# Patient Record
Sex: Female | Born: 1965
Health system: Southern US, Community
[De-identification: ages and names within clinical notes are randomized; demographics above are authoritative.]

## PROBLEM LIST (undated history)

## (undated) DIAGNOSIS — I10 Essential (primary) hypertension: Secondary | ICD-10-CM

## (undated) DIAGNOSIS — R87610 Atypical squamous cells of undetermined significance on cytologic smear of cervix (ASC-US): Principal | ICD-10-CM

## (undated) DIAGNOSIS — Z87891 Personal history of nicotine dependence: Secondary | ICD-10-CM

## (undated) DIAGNOSIS — F419 Anxiety disorder, unspecified: Secondary | ICD-10-CM

## (undated) DIAGNOSIS — R87810 Cervical high risk human papillomavirus (HPV) DNA test positive: Principal | ICD-10-CM

## (undated) DIAGNOSIS — IMO0002 Reserved for concepts with insufficient information to code with codable children: Secondary | ICD-10-CM

## (undated) DIAGNOSIS — E049 Nontoxic goiter, unspecified: Secondary | ICD-10-CM

## (undated) DIAGNOSIS — K219 Gastro-esophageal reflux disease without esophagitis: Secondary | ICD-10-CM

## (undated) DIAGNOSIS — IMO0001 Reserved for inherently not codable concepts without codable children: Secondary | ICD-10-CM

## (undated) DIAGNOSIS — B0221 Postherpetic geniculate ganglionitis: Secondary | ICD-10-CM

## (undated) HISTORY — PX: TUBAL LIGATION: SHX77

## (undated) HISTORY — DX: Nontoxic goiter, unspecified: E04.9

## (undated) HISTORY — DX: Reserved for concepts with insufficient information to code with codable children: IMO0002

## (undated) HISTORY — DX: Essential (primary) hypertension: I10

## (undated) HISTORY — DX: Postherpetic geniculate ganglionitis: B02.21

## (undated) HISTORY — DX: Personal history of nicotine dependence: Z87.891

## (undated) HISTORY — PX: OTHER SURGICAL HISTORY: SHX169

## (undated) HISTORY — DX: Cervical high risk human papillomavirus (HPV) DNA test positive: R87.810

## (undated) HISTORY — DX: Anxiety disorder, unspecified: F41.9

## (undated) HISTORY — DX: Atypical squamous cells of undetermined significance on cytologic smear of cervix (ASC-US): R87.610

## (undated) HISTORY — DX: Reserved for inherently not codable concepts without codable children: IMO0001

## (undated) HISTORY — DX: Gastro-esophageal reflux disease without esophagitis: K21.9

---

## 1999-07-07 ENCOUNTER — Other Ambulatory Visit: Admission: RE | Admit: 1999-07-07 | Discharge: 1999-07-07 | Payer: Self-pay | Admitting: Gynecology

## 2001-03-22 ENCOUNTER — Other Ambulatory Visit: Admission: RE | Admit: 2001-03-22 | Discharge: 2001-03-22 | Payer: Self-pay | Admitting: Gynecology

## 2003-03-30 ENCOUNTER — Other Ambulatory Visit: Admission: RE | Admit: 2003-03-30 | Discharge: 2003-03-30 | Payer: Self-pay | Admitting: Gynecology

## 2004-04-18 ENCOUNTER — Other Ambulatory Visit: Admission: RE | Admit: 2004-04-18 | Discharge: 2004-04-18 | Payer: Self-pay | Admitting: Gynecology

## 2005-05-31 ENCOUNTER — Other Ambulatory Visit: Admission: RE | Admit: 2005-05-31 | Discharge: 2005-05-31 | Payer: Self-pay | Admitting: Gynecology

## 2006-07-25 ENCOUNTER — Ambulatory Visit: Payer: Self-pay | Admitting: Family Medicine

## 2006-08-09 ENCOUNTER — Ambulatory Visit: Payer: Self-pay | Admitting: Gastroenterology

## 2006-08-10 ENCOUNTER — Ambulatory Visit: Payer: Self-pay | Admitting: Family Medicine

## 2006-08-10 DIAGNOSIS — M549 Dorsalgia, unspecified: Secondary | ICD-10-CM | POA: Insufficient documentation

## 2006-08-10 DIAGNOSIS — L589 Radiodermatitis, unspecified: Secondary | ICD-10-CM | POA: Insufficient documentation

## 2006-08-10 DIAGNOSIS — R1011 Right upper quadrant pain: Secondary | ICD-10-CM | POA: Insufficient documentation

## 2006-08-10 DIAGNOSIS — F17200 Nicotine dependence, unspecified, uncomplicated: Secondary | ICD-10-CM | POA: Insufficient documentation

## 2006-09-12 ENCOUNTER — Ambulatory Visit: Payer: Self-pay | Admitting: Gastroenterology

## 2006-09-12 ENCOUNTER — Encounter: Payer: Self-pay | Admitting: Family Medicine

## 2006-09-18 ENCOUNTER — Encounter: Payer: Self-pay | Admitting: Family Medicine

## 2006-12-28 ENCOUNTER — Ambulatory Visit: Payer: Self-pay | Admitting: Gastroenterology

## 2007-01-01 ENCOUNTER — Encounter: Payer: Self-pay | Admitting: Family Medicine

## 2007-01-01 ENCOUNTER — Ambulatory Visit: Payer: Self-pay | Admitting: Gastroenterology

## 2007-04-15 DIAGNOSIS — K649 Unspecified hemorrhoids: Secondary | ICD-10-CM | POA: Insufficient documentation

## 2007-04-15 DIAGNOSIS — F411 Generalized anxiety disorder: Secondary | ICD-10-CM | POA: Insufficient documentation

## 2007-04-15 DIAGNOSIS — K625 Hemorrhage of anus and rectum: Secondary | ICD-10-CM | POA: Insufficient documentation

## 2007-04-15 DIAGNOSIS — K219 Gastro-esophageal reflux disease without esophagitis: Secondary | ICD-10-CM | POA: Insufficient documentation

## 2007-06-17 DIAGNOSIS — IMO0002 Reserved for concepts with insufficient information to code with codable children: Secondary | ICD-10-CM

## 2007-06-17 HISTORY — DX: Reserved for concepts with insufficient information to code with codable children: IMO0002

## 2007-12-06 ENCOUNTER — Other Ambulatory Visit: Admission: RE | Admit: 2007-12-06 | Discharge: 2007-12-06 | Payer: Self-pay | Admitting: Gynecology

## 2007-12-06 ENCOUNTER — Encounter: Payer: Self-pay | Admitting: Women's Health

## 2007-12-06 ENCOUNTER — Ambulatory Visit: Payer: Self-pay | Admitting: Women's Health

## 2007-12-23 ENCOUNTER — Ambulatory Visit: Payer: Self-pay | Admitting: Women's Health

## 2008-01-14 ENCOUNTER — Encounter (INDEPENDENT_AMBULATORY_CARE_PROVIDER_SITE_OTHER): Payer: Self-pay | Admitting: Orthopedic Surgery

## 2008-01-14 ENCOUNTER — Ambulatory Visit (HOSPITAL_BASED_OUTPATIENT_CLINIC_OR_DEPARTMENT_OTHER): Admission: RE | Admit: 2008-01-14 | Discharge: 2008-01-14 | Payer: Self-pay | Admitting: Orthopedic Surgery

## 2008-06-24 ENCOUNTER — Encounter: Payer: Self-pay | Admitting: Gastroenterology

## 2009-03-04 ENCOUNTER — Other Ambulatory Visit: Admission: RE | Admit: 2009-03-04 | Discharge: 2009-03-04 | Payer: Self-pay | Admitting: Gynecology

## 2009-03-04 ENCOUNTER — Ambulatory Visit: Payer: Self-pay | Admitting: Women's Health

## 2009-12-16 DIAGNOSIS — B0221 Postherpetic geniculate ganglionitis: Secondary | ICD-10-CM

## 2009-12-16 HISTORY — DX: Postherpetic geniculate ganglionitis: B02.21

## 2009-12-18 ENCOUNTER — Emergency Department (HOSPITAL_COMMUNITY)
Admission: EM | Admit: 2009-12-18 | Discharge: 2009-12-18 | Payer: Self-pay | Source: Home / Self Care | Admitting: Emergency Medicine

## 2010-03-14 ENCOUNTER — Other Ambulatory Visit (HOSPITAL_COMMUNITY)
Admission: RE | Admit: 2010-03-14 | Discharge: 2010-03-14 | Disposition: A | Discharge status: Home / Self Care | Payer: BC Managed Care – PPO | Source: Ambulatory Visit | Attending: Gynecology | Admitting: Gynecology

## 2010-03-14 ENCOUNTER — Encounter (INDEPENDENT_AMBULATORY_CARE_PROVIDER_SITE_OTHER): Payer: BC Managed Care – PPO | Admitting: Women's Health

## 2010-03-14 ENCOUNTER — Other Ambulatory Visit: Payer: Self-pay | Admitting: Women's Health

## 2010-03-14 DIAGNOSIS — Z833 Family history of diabetes mellitus: Secondary | ICD-10-CM

## 2010-03-14 DIAGNOSIS — Z01419 Encounter for gynecological examination (general) (routine) without abnormal findings: Secondary | ICD-10-CM

## 2010-03-14 DIAGNOSIS — Z13220 Encounter for screening for lipoid disorders: Secondary | ICD-10-CM

## 2010-03-14 DIAGNOSIS — E079 Disorder of thyroid, unspecified: Secondary | ICD-10-CM

## 2010-03-14 DIAGNOSIS — Z124 Encounter for screening for malignant neoplasm of cervix: Secondary | ICD-10-CM | POA: Insufficient documentation

## 2010-03-29 LAB — BASIC METABOLIC PANEL
CO2: 26 mEq/L (ref 19–32)
Calcium: 9.4 mg/dL (ref 8.4–10.5)
Creatinine, Ser: 0.86 mg/dL (ref 0.4–1.2)
GFR calc Af Amer: >60 mL/min (ref >60)
GFR calc non Af Amer: >60 mL/min (ref >60)

## 2010-03-29 LAB — DIFFERENTIAL
Basophils Relative: 1 % (ref 0–1)
Eosinophils Absolute: 0.2 10*3/uL (ref 0.0–0.7)
Lymphs Abs: 2.8 10*3/uL (ref 0.7–4.0)
Neutro Abs: 4.7 10*3/uL (ref 1.7–7.7)
Neutrophils Relative %: 55 % (ref 43–77)

## 2010-03-29 LAB — URINALYSIS, ROUTINE W REFLEX MICROSCOPIC
Hgb urine dipstick: NEGATIVE
Nitrite: NEGATIVE
Protein, ur: NEGATIVE mg/dL
Specific Gravity, Urine: 1.012 (ref 1.005–1.030)
Urobilinogen, UA: 0.2 mg/dL (ref 0.0–1.0)

## 2010-03-29 LAB — CBC
MCH: 31 pg (ref 26.0–34.0)
Platelets: 242 10*3/uL (ref 150–400)
RBC: 4.22 MIL/uL (ref 3.87–5.11)

## 2010-05-31 NOTE — Assessment & Plan Note (Signed)
[facility] OFFICE NOTE   Annette Wallace, Annette Wallace                       MRN:          [id number]  DATE:07/25/2006                            DOB:          May 03, 1965    NEW PATIENT EVALUATION   PRIMARY CARE PHYSICIAN:  Tinnie Gens A. Tawanna Cooler, M.D. at [place].   [name] is a 45 year old married white female gravida 2, para 2, AB 0  who comes in today as a new patient for evaluation of 2 problems.   PAST MEDICAL HISTORY:  Hospitalized for T&A as a child.  Childbirth x2.  A BTL after a second child.  She has a 104 year old son and a 13 year old  daughter, both in good health.   PAST ILLNESSES:  None.   INJURIES:  None.   DRUG ALLERGIES:  PENICILLIN and CODEINE both give her hives.   She smokes a pack of cigarettes a day.  Is unable to get off cigarettes.  She tried Chantix, but she complains of CNS side effects.  She said it  made her feel suicidal.  She has not drunk any alcohol.   MEDICATIONS:  1. Trazodone 50 mg 1/2 tablet nightly for sleep dysfunction.  2. An acid reducer because of chronic acid reflux with secondary to      nicotine.   FAMILY HISTORY:  Dad just recently died a week ago at age 58.  He was  retired Company secretary.  He went in for a carotid bypass surgery and died  post-op.  Prior to that, he had had an MI, and a CABG in 2007.  She does  not recall his underlying history.  I asked her to find out.  Mom is 71  and has a history of anxiety disorder and drug addiction.  One brother  and 1 sister both in good health, except her sister has had  hypothyroidism.   SOCIAL HISTORY:  She is married and lives here in [place], [place] for 25 years, originally from [place], [place].  She has 2  children.  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   Last Pap smear was about a year ago.  Last physical, she has not had 1  in years.   PROBLEM:  1. The patient had a  syncopal episode.  She said she was at the [facility]      with her brother in [place] sitting on a bench, felt light-      headed, went to get up to go to the bathroom, and passed out.  She      was taken by EMS to [facility] evaluation and      dictated notes are with her.  All of that evaluation was within      normal limits.  She subsequently left the hospital AMA because she      did not want to wait any further.  She comes in today at the behest      of her gynecologist, who is Dr. Lily Peer at [facility]  for medical evaluation.  She has not had any previous syncopal      episodes in the past.  She has had light-headed spells, 3, 4, or 5      times a year, but she has never passed out.  She did not have any      seizure activity.  Did not have any urinary incontinence, et      Annette Wallace.  Neurological review of systems negative.  2. The patient complains of a 2 to 3 year history of abdominal pain      and nausea.  She says it comes and goes worse in the morning.  She      points to the midepigastric and the right upper quadrant area of      her abdomen as the source of her discomfort.  She has no fevers or      chills, or diarrhea.  No change in bowel habits.  No change in      urinary habits.   FAMILY HISTORY:  Negative for abdominal problems, including gallbladder  disease.   PHYSICAL EVALUATION:  Height 5 feet 3 inches, weight 137, BP 110/66,  pulse 70 and regular, she is afebrile.  GENERAL:  She is a well-developed, well-nourished white female with a  deep tan.  She does not wear sunscreen.  She is from [place].  She  smells of tobacco.  Examination of the head, eyes, ears, nose, and throat were negative.  NECK:  Supple.  Thyroid is not enlarged.  No carotid bruits.  NEUROLOGIC:  Exam negative.  ABDOMEN:  Abdomen is flat.  Bowel sounds normal.  Liver, spleen, and  kidneys not enlarged.   IMPRESSION:  1. Syncopal episode.  Explained to her  what that was and what to do in      the future.  2. Abdominal discomfort, question gallbladder disease.  We will put      her on a fat-free diet and get an ultrasound of her gallbladder,      followup 3 days after ultrasound.  3. Tobacco abuse.  I outlined a program of smoking cessation.      However, she says she cannot take the Chantix.  4. She has had a lot of skin damage from the sun exposure.  I advised      her to wear sunscreen, SPF 50, hat with a bill.     Jeffrey A. Tawanna Cooler, MD  Electronically Signed    JAT/MedQ  DD: 07/25/2006  DT: 07/25/2006  Job #: [id number]   cc:   Gaetano Hawthorne. Lily Peer, M.D.

## 2010-05-31 NOTE — Op Note (Signed)
NAMEELISAVET, BUEHRER                ACCOUNT NO.:  [id number]   MEDICAL RECORD NO.:  [id number]          PATIENT TYPE:  AMB   LOCATION:  DSC                          FACILITY:  [facility]   PHYSICIAN:  Katy Fitch. Sypher, M.D. DATE OF BIRTH:  11/15/1965   DATE OF PROCEDURE:  01/14/2008  DATE OF DISCHARGE:                               OPERATIVE REPORT   PREOPERATIVE DIAGNOSIS:  Mass, volar aspect of right wrist, adjacent to  palmaris longus tendon with inflammatory features and enlargement and  MRI enhancement with contrast.  The radiologist who reviewed the MRI  suggested that this could be an nerve-element tumor or tendinosis of the  palmaris longus tendon.   POSTOPERATIVE DIAGNOSIS:  Mid-substance rupture of palmaris longus  tendon with calcified necrotic distal tendon stump.   OPERATIONS:  Exploration of volar forearm mass with resection of  calcific probable avascular distal segment of palmaris longus and  surrounding synovitis.   OPERATIONS:  Katy Fitch. Sypher, MD   ASSISTANT:  No assistant.   ANESTHESIA:  General by LMA.   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 Miguel Aschoff for evaluation  and management of a volar right wrist mass.   Ms. Harral had the experiences for several weeks and noted inflammation  including rubor and discomfort adjacent to the palmaris longus and  flexor carpi radialis tendons of the distal forearm.  Clinical  examination revealed a mass that was approximately 6 cm in length, 5-6  mm in width, and clearly inflammatory.  This had been enlarging.   This appeared to be in the region of the palmaris longus tendon;  however, due to its proximity to the median nerve and its growth, we  elected to proceed with an MRI.  On December 26, 2007, she was sent for  an MRI at [facility] that was reviewed by Dr. Dagoberto Reef.  Dr.  Vear Clock  was concerned about the appearance of the mass and requested  further cuts be performed with contrast.  A gadolinium-enhanced MRI was  performed on December 31, 2007, which revealed enhancement of the mass.  Dr. Vear Clock concluded that this could be tendinosis of the palmaris  longus tendon or perhaps a nerve-element tumor of the cutaneous nerve.   Given the latter statement, we felt obligated to proceed with excisional  biopsy for definitive diagnosis.   Preoperatively, Ms. Verne was advised of the anticipated surgical  procedure.  She was informed that this was for diagnosis and hopeful  resolution of this process.  We could not state with certainty whether  or not this was a benign process or some type of neoplasm.   After informed consent, she is brought to the operating room at this  time.   PROCEDURE:  Blair Mesina was brought to the operating room and placed in  the supine position upon the operating table.   Following an anesthesia consult with Dr. Gypsy Balsam, general anesthesia by  LMA technique was recommended and accepted.   Ms. Budzinski  was brought to room #2 of the [facility], placed in  the supine position upon the operating table, and under Dr. Burnett Corrente  direct supervision, general anesthesia by LMA technique was induced.   The right arm was prepped with Betadine soap and solution, sterilely  draped.  Ancef 1 g was administered as an IV prophylactic antibiotic.   The procedure was commenced with a longitudinal incision measuring  approximate 5 cm.  This was taken directly down to the area of the  palmaris longus tendon.  Immediately upon parting the dermis, we  identified what appeared to be a calcific, very necrotic-appearing  palmaris longus tendon.  This was dissected proximally until pseudo-  tendon was encountered.  It was obvious that Ms. Gullo had sustained a  mid-substance rupture of the palmaris longus with distal retraction of  the distal fragment with  probable necrosis and then significant  peritendinous inflammatory response.  The mass did not perforate deep to  the deep fascia and did not involve the median nerve or the flexor carpi  radialis.  The palmar cutaneous branch of the median nerve was  preserved.   This tendon stump was delivered as a biopsy specimen.  The wound was  then inspected for bleeding points and subsequent repaired with  subcutaneous suture of 4-0 Vicryl and intradermal segmental 3-0 Prolene  with Steri-Strips.  Lidocaine 2% was infiltrated for postoperative  comfort.  Ms. Rappleye tolerated the surgery and anesthesia well.  She was  transferred to the recovery room in stable signs.   We will see her back for followup in approximately 1 week for suture  removal and advancement into an exercise program.      Katy Fitch. Sypher, M.D.  Electronically Signed     RVS/MEDQ  D:  01/14/2008  T:  01/15/2008  Job:  [id number]   cc:   C. Duane Lope, M.D.

## 2010-05-31 NOTE — Assessment & Plan Note (Signed)
[facility]                         GASTROENTEROLOGY OFFICE NOTE   Katty, Fretwell YOUNIQUE CASAD                       MRN:          [id number]  DATE:09/12/2006                            DOB:          [birth year]    REASON FOR REFERRAL:  Dr. Tawanna Cooler asked me to evaluate Ms. Finkbiner in consult  for her epigastric pain and bright red blood per rectum.   HISTORY OF PRESENT ILLNESS:  Ms. Kuhnle is a very pleasant 45 year old  woman who has had at least four years of intermittent abdominal  discomfort.  She describes this as a bloating, belching sensation  associated with nausea.  It happens on a daily basis.  Caffeine is a  definite trigger for this as well as orange juice.  She has been on H2  blocker famotidine four times a day for several years.  Without that,  she will have bad pyrosis.  She has tried Prilosec in the past and says  it does not work much better than the H2 blocker.  For the past several  months or so, she has been having intermittent solid food dysphagia  approximately one time a week and has actually had to vomit up food to  get relief at times.  She had recent abdominal ultrasound to evaluate  this pain in July, 2008, and it was normal.   She also has had some mild rectal bleeding.  She thought this was  hemorrhoids.  It happened about three months ago for 1-2 days.  It was  while she was moving her bowels.   The review of systems is essentially normal and is available on her  nursing intake sheet.   PAST MEDICAL HISTORY:  None.   CURRENT MEDICATIONS:  Trazodone and aspirin 81 mg.   ALLERGIES:  PENICILLIN.   SOCIAL HISTORY:  Married with four children.  Self-employed.  Owns a  IT consultant.  Smokes one pack of cigarettes a day.  Drinks at least  2-3 coffees a day.  Does not eat a lot of peppermints, chocolate, does  not drink alcohol.   FAMILY HISTORY:  No colon cancer or colon polyps in the family.   PHYSICAL EXAMINATION:  Height 5  feet 3 inches.  Weight 137 pounds.  Blood pressure 114/78, pulse 64.  CONSTITUTIONAL:  Generally well-appearing.  NEUROLOGIC:  Alert and oriented x3.  Eyes:  Extraocular movements intact.  Mouth:  Oropharynx moist, no  lesions.  NECK:  Supple.  No lymphadenopathy.  CARDIOVASCULAR:  Heart regular rate and rhythm.  LUNGS:  Clear to auscultation bilaterally.  ABDOMEN:  Soft, nontender, nondistended.  Normal bowel sounds.  EXTREMITIES:  No lower extremity edema.  SKIN:  No rashes or lesions of the visible extremities.   ASSESSMENT/PLAN:  A 45 year old woman with likely gastroesophageal  reflux disease related dyspepsia, minor rectal bleeding, probably  hemorrhoidal.   I think she does have a problem with acid reflux.  I have instructed her  to change from the H2 blocker to PPI.  I have given her samples of  Nexium and instructed her to take it 20-30 minutes prior to  her dinner  meal on a daily basis.  I have also given her a GERD handout and  discussed some lifestyle modifications she could employ to reduce acid  regurgitation, including cutting back on cigarette smoking and caffeine.  She does have some mild solid food dysphagia that is probably not from  malignancy, but we should proceed with an EGD at her soonest convenience  to screen her for chronic gastroesophageal reflux disease complications,  such as Barrett's esophagitis.  Her second and likely unrelated problem  is minor rectal bleeding that occurred 2-3 months ago.  She will get a  CBC to make sure she is not anemic, although she does not appear to be  so clinically.  I will arrange for her to have a full colonoscopy at her  soonest convenience at the same time as her upper endoscopy.  She wants  to check out her deductible and her copays for these two procedures and  will get back to me with word on whether she wants to go ahead and  proceed with one or both of them.     Rachael Fee, MD  Electronically Signed     DPJ/MedQ  DD: 09/12/2006  DT: 09/13/2006  Job #: [id number]   cc:   Tinnie Gens A. Tawanna Cooler, MD

## 2010-06-07 ENCOUNTER — Ambulatory Visit (INDEPENDENT_AMBULATORY_CARE_PROVIDER_SITE_OTHER): Payer: BC Managed Care – PPO | Admitting: Gynecology

## 2010-06-07 DIAGNOSIS — R350 Frequency of micturition: Secondary | ICD-10-CM

## 2010-06-07 DIAGNOSIS — Z113 Encounter for screening for infections with a predominantly sexual mode of transmission: Secondary | ICD-10-CM

## 2010-06-07 DIAGNOSIS — N898 Other specified noninflammatory disorders of vagina: Secondary | ICD-10-CM

## 2010-06-07 DIAGNOSIS — B373 Candidiasis of vulva and vagina: Secondary | ICD-10-CM

## 2010-06-14 ENCOUNTER — Other Ambulatory Visit: Payer: Self-pay | Admitting: Gastroenterology

## 2010-06-15 NOTE — Telephone Encounter (Signed)
Pt needs office visit for further refills 

## 2010-10-21 LAB — POCT HEMOGLOBIN-HEMACUE: Hemoglobin: 14.1 g/dL (ref 12.0–15.0)

## 2011-01-12 ENCOUNTER — Other Ambulatory Visit: Payer: Self-pay | Admitting: Family Medicine

## 2011-01-12 DIAGNOSIS — R531 Weakness: Secondary | ICD-10-CM

## 2011-01-12 DIAGNOSIS — M542 Cervicalgia: Secondary | ICD-10-CM

## 2011-01-13 ENCOUNTER — Other Ambulatory Visit: Payer: BC Managed Care – PPO

## 2011-01-19 ENCOUNTER — Ambulatory Visit
Admission: RE | Admit: 2011-01-19 | Discharge: 2011-01-19 | Disposition: A | Discharge status: Home / Self Care | Payer: BC Managed Care – PPO | Source: Ambulatory Visit | Attending: Family Medicine | Admitting: Family Medicine

## 2011-01-19 DIAGNOSIS — M542 Cervicalgia: Secondary | ICD-10-CM

## 2011-01-19 DIAGNOSIS — R531 Weakness: Secondary | ICD-10-CM

## 2011-01-19 MED ORDER — GADOBENATE DIMEGLUMINE 529 MG/ML IV SOLN
14.0000 mL | Freq: Once | INTRAVENOUS | Status: AC | PRN
  Administered 2011-01-19: 14 mL via INTRAVENOUS

## 2011-02-06 ENCOUNTER — Other Ambulatory Visit: Payer: Self-pay | Admitting: Family Medicine

## 2011-02-06 DIAGNOSIS — E049 Nontoxic goiter, unspecified: Secondary | ICD-10-CM

## 2011-02-10 ENCOUNTER — Ambulatory Visit
Admission: RE | Admit: 2011-02-10 | Discharge: 2011-02-10 | Disposition: A | Discharge status: Home / Self Care | Payer: BC Managed Care – PPO | Source: Ambulatory Visit | Attending: Family Medicine | Admitting: Family Medicine

## 2011-02-10 ENCOUNTER — Other Ambulatory Visit: Payer: BC Managed Care – PPO

## 2011-02-10 DIAGNOSIS — E049 Nontoxic goiter, unspecified: Secondary | ICD-10-CM

## 2011-02-17 HISTORY — PX: CERVICAL SPINE SURGERY: SHX589

## 2011-03-08 ENCOUNTER — Telehealth: Payer: Self-pay | Admitting: *Deleted

## 2011-03-08 ENCOUNTER — Other Ambulatory Visit: Payer: Self-pay | Admitting: Neurosurgery

## 2011-03-08 ENCOUNTER — Ambulatory Visit
Admission: RE | Admit: 2011-03-08 | Discharge: 2011-03-08 | Disposition: A | Discharge status: Home / Self Care | Payer: BC Managed Care – PPO | Source: Ambulatory Visit | Attending: Neurosurgery | Admitting: Neurosurgery

## 2011-03-08 DIAGNOSIS — M542 Cervicalgia: Secondary | ICD-10-CM

## 2011-03-08 MED ORDER — ESOMEPRAZOLE MAGNESIUM 40 MG PO CPDR: 40.0000 mg | DELAYED_RELEASE_CAPSULE | Freq: Every day | ORAL | Status: DC

## 2011-03-08 NOTE — Telephone Encounter (Signed)
rx sent to pharmacy

## 2011-03-08 NOTE — Telephone Encounter (Signed)
Please call in nexium 40  #30 for pt and we will review at annual exam.

## 2011-03-08 NOTE — Telephone Encounter (Signed)
Pt called requesting refill on nexium 40 mg. Pt has annual schedule on 03/16/11. Okay to fill?

## 2011-03-13 DIAGNOSIS — IMO0002 Reserved for concepts with insufficient information to code with codable children: Secondary | ICD-10-CM | POA: Insufficient documentation

## 2011-03-13 DIAGNOSIS — F419 Anxiety disorder, unspecified: Secondary | ICD-10-CM | POA: Insufficient documentation

## 2011-03-13 DIAGNOSIS — IMO0001 Reserved for inherently not codable concepts without codable children: Secondary | ICD-10-CM | POA: Insufficient documentation

## 2011-03-13 DIAGNOSIS — B0221 Postherpetic geniculate ganglionitis: Secondary | ICD-10-CM | POA: Insufficient documentation

## 2011-03-13 DIAGNOSIS — Z87891 Personal history of nicotine dependence: Secondary | ICD-10-CM | POA: Insufficient documentation

## 2011-03-16 ENCOUNTER — Ambulatory Visit (INDEPENDENT_AMBULATORY_CARE_PROVIDER_SITE_OTHER): Payer: BC Managed Care – PPO | Admitting: Women's Health

## 2011-03-16 ENCOUNTER — Encounter: Payer: Self-pay | Admitting: Women's Health

## 2011-03-16 VITALS — BP 120/78 | Ht 63.0 in | Wt 143.0 lb

## 2011-03-16 DIAGNOSIS — F411 Generalized anxiety disorder: Secondary | ICD-10-CM

## 2011-03-16 DIAGNOSIS — F419 Anxiety disorder, unspecified: Secondary | ICD-10-CM

## 2011-03-16 DIAGNOSIS — B009 Herpesviral infection, unspecified: Secondary | ICD-10-CM

## 2011-03-16 MED ORDER — VALACYCLOVIR HCL 500 MG PO TABS: 500.0000 mg | ORAL_TABLET | Freq: Two times a day (BID) | ORAL | Status: AC

## 2011-03-16 MED ORDER — ALPRAZOLAM 0.25 MG PO TABS: 0.2500 mg | ORAL_TABLET | Freq: Every evening | ORAL | Status: AC | PRN

## 2011-03-16 NOTE — Progress Notes (Signed)
Annette Wallace 1966-01-07 [id number]    History:    The patient presents for annual exam.  Monthly 7 day cycle/BTL. Rare HSV outbreaks. Smoker had quit is now smoking several cigarettes per day, using Nicoderm patch. Currently treated for hypercholesterolemia with Lipitor. Bell's palsy. History of normal Paps and mammograms.   Past medical history, past surgical history, family history and social history were all reviewed and documented in the EPIC chart. History of left eye cancer in 2009, has annual skin checks at [facility].   ROS:  A  ROS was performed and pertinent positives and negatives are included in the history.  Exam:  Filed Vitals:   03/16/11 1404  BP: 120/78    General appearance:  Normal Head/Neck:  Normal, without cervical or supraclavicular adenopathy. Thyroid:  Symmetrical, normal in size, without palpable masses or nodularity. Respiratory  Effort:  Normal  Auscultation:  Clear without wheezing or rhonchi Cardiovascular  Auscultation:  Regular rate, without rubs, murmurs or gallops  Edema/varicosities:  Not grossly evident Abdominal  Soft,nontender, without masses, guarding or rebound.  Liver/spleen:  No organomegaly noted  Hernia:  None appreciated  Skin  Inspection:  Grossly normal  Palpation:  Grossly normal Neurologic/psychiatric  Orientation:  Normal with appropriate conversation.  Mood/affect:  Normal  Genitourinary    Breasts: Examined lying and sitting.     Right: Without masses, retractions, discharge or axillary adenopathy.     Left: Without masses, retractions, discharge or axillary adenopathy.   Inguinal/mons:  Normal without inguinal adenopathy  External genitalia:  Normal  BUS/Urethra/Skene's glands:  Normal  Bladder:  Normal  Vagina:  Normal  Cervix:  Normal  Uterus:   normal in size, shape and contour.  Midline and mobile  Adnexa/parametria:     Rt: Without masses or tenderness.   Lt: Without masses or tenderness.  Anus and  perineum: Normal  Digital rectal exam: Normal sphincter tone without palpated masses or tenderness  Assessment/Plan:  46 y.o. MWF G2 P2 for annual exam.     Normal GYN exam Rare HSV outbreaks Hypercholesterolemia-lipitor, Reflux/GERD stable on Nexium 40 daily-labs and meds at primary care Smoker 2-5 cigarettes per day Marital issues  Plan: Continue counseling for marriage, denies any infidelity or abuse. SBE's, annual mammogram, exercise, calcium rich diet, vitamin D 2000 daily. Valtrex 500 by mouth twice a day 2-3 days when necessary, prescription given. Xanax 0.25 at bedtime when necessary for rest. Is aware addictive will use sparingly. Continue labs and meds at primary care for GERD and hypercholesterolemia. Continue trying to decrease cigarettes, is aware of hazards to health. ACOG current Pap recommendations reviewed.      Harrington Challenger [facility], 5:14 PM 03/16/2011

## 2011-05-23 ENCOUNTER — Telehealth: Payer: Self-pay | Admitting: *Deleted

## 2011-05-23 NOTE — Telephone Encounter (Signed)
Pt is on prednisone by another dr and c/o vaginal itching. Requesting something for it. No d/c or any other sx's.

## 2011-05-24 MED ORDER — TERCONAZOLE 0.4 % VA CREA: TOPICAL_CREAM | VAGINAL | Status: DC

## 2011-05-24 NOTE — Telephone Encounter (Signed)
Please call in terazol 7 vag cream  1 applicator hs for 7, ov if no relief.

## 2011-05-24 NOTE — Telephone Encounter (Signed)
Left message rx sent on pt voicemail

## 2011-06-14 ENCOUNTER — Other Ambulatory Visit: Payer: Self-pay | Admitting: Neurosurgery

## 2011-06-14 DIAGNOSIS — M542 Cervicalgia: Secondary | ICD-10-CM

## 2011-06-20 ENCOUNTER — Ambulatory Visit
Admission: RE | Admit: 2011-06-20 | Discharge: 2011-06-20 | Disposition: A | Discharge status: Home / Self Care | Payer: BC Managed Care – PPO | Source: Ambulatory Visit | Attending: Neurosurgery | Admitting: Neurosurgery

## 2011-06-20 DIAGNOSIS — M542 Cervicalgia: Secondary | ICD-10-CM

## 2011-07-03 ENCOUNTER — Telehealth: Payer: Self-pay | Admitting: *Deleted

## 2011-07-03 MED ORDER — NAPROXEN SODIUM 550 MG PO TABS: 550.0000 mg | ORAL_TABLET | Freq: Every day | ORAL | Status: DC

## 2011-07-03 NOTE — Telephone Encounter (Signed)
Yes please call in for pt.  

## 2011-07-03 NOTE — Telephone Encounter (Signed)
rx sent to pharmacy, pt informed. 

## 2011-07-03 NOTE — Telephone Encounter (Signed)
Pt calling requesting rx on naproxen 550 mg, okay to fill?

## 2011-09-02 ENCOUNTER — Other Ambulatory Visit: Payer: Self-pay | Admitting: Women's Health

## 2011-09-06 NOTE — Telephone Encounter (Signed)
Okay to refill, please call patient remind not to take on a regular basis, bad for the liver.

## 2011-09-28 ENCOUNTER — Telehealth: Payer: Self-pay | Admitting: *Deleted

## 2011-09-28 ENCOUNTER — Other Ambulatory Visit: Payer: Self-pay | Admitting: Women's Health

## 2011-09-28 DIAGNOSIS — F419 Anxiety disorder, unspecified: Secondary | ICD-10-CM

## 2011-09-28 MED ORDER — ALPRAZOLAM 0.25 MG PO TABS: 0.2500 mg | ORAL_TABLET | Freq: Every evening | ORAL | Status: AC | PRN

## 2011-09-28 NOTE — Telephone Encounter (Signed)
TC - Marriage problems, will continue counseling, requests xanax for rest and on edge.  Will call in.

## 2011-09-28 NOTE — Telephone Encounter (Signed)
Pt asked you to call her at [phone].

## 2011-09-29 NOTE — Progress Notes (Signed)
xanax 0.49m tablet called into pharmacy.

## 2011-10-19 DIAGNOSIS — G5139 Clonic hemifacial spasm, unspecified: Secondary | ICD-10-CM | POA: Insufficient documentation

## 2011-11-02 ENCOUNTER — Other Ambulatory Visit: Payer: Self-pay | Admitting: Women's Health

## 2011-11-10 ENCOUNTER — Other Ambulatory Visit: Payer: Self-pay | Admitting: Women's Health

## 2012-04-18 ENCOUNTER — Other Ambulatory Visit: Payer: Self-pay | Admitting: Women's Health

## 2013-01-23 ENCOUNTER — Other Ambulatory Visit: Payer: Self-pay | Admitting: Women's Health

## 2013-01-23 MED ORDER — VALACYCLOVIR HCL 500 MG PO TABS: 500.0000 mg | ORAL_TABLET | Freq: Two times a day (BID) | ORAL | Status: DC

## 2013-01-23 NOTE — Telephone Encounter (Signed)
Pt has annual scheduled on 02/12/13. Pt also would like refill for valtrex as well.

## 2013-02-05 ENCOUNTER — Ambulatory Visit (INDEPENDENT_AMBULATORY_CARE_PROVIDER_SITE_OTHER): Payer: BC Managed Care – PPO | Admitting: Women's Health

## 2013-02-05 ENCOUNTER — Other Ambulatory Visit (HOSPITAL_COMMUNITY)
Admission: RE | Admit: 2013-02-05 | Discharge: 2013-02-05 | Disposition: A | Discharge status: Home / Self Care | Payer: BC Managed Care – PPO | Source: Ambulatory Visit | Attending: Gynecology | Admitting: Gynecology

## 2013-02-05 ENCOUNTER — Encounter: Payer: Self-pay | Admitting: Women's Health

## 2013-02-05 VITALS — BP 110/70 | Ht 65.0 in | Wt 152.0 lb

## 2013-02-05 DIAGNOSIS — J029 Acute pharyngitis, unspecified: Secondary | ICD-10-CM

## 2013-02-05 DIAGNOSIS — Z01419 Encounter for gynecological examination (general) (routine) without abnormal findings: Secondary | ICD-10-CM | POA: Insufficient documentation

## 2013-02-05 DIAGNOSIS — Z833 Family history of diabetes mellitus: Secondary | ICD-10-CM

## 2013-02-05 DIAGNOSIS — E079 Disorder of thyroid, unspecified: Secondary | ICD-10-CM

## 2013-02-05 DIAGNOSIS — Z13220 Encounter for screening for lipoid disorders: Secondary | ICD-10-CM

## 2013-02-05 DIAGNOSIS — E039 Hypothyroidism, unspecified: Secondary | ICD-10-CM

## 2013-02-05 LAB — CBC WITH DIFFERENTIAL/PLATELET
BASOS PCT: 1 % (ref 0–1)
Basophils Absolute: 0.1 10*3/uL (ref 0.0–0.1)
Eosinophils Absolute: 0.1 10*3/uL (ref 0.0–0.7)
Eosinophils Relative: 2 % (ref 0–5)
HEMATOCRIT: 37.9 % (ref 36.0–46.0)
HEMOGLOBIN: 13 g/dL (ref 12.0–15.0)
LYMPHS ABS: 1.7 10*3/uL (ref 0.7–4.0)
Lymphocytes Relative: 28 % (ref 12–46)
MCH: 30.6 pg (ref 26.0–34.0)
MCHC: 34.3 g/dL (ref 30.0–36.0)
MCV: 89.2 fL (ref 78.0–100.0)
MONO ABS: 0.5 10*3/uL (ref 0.1–1.0)
MONOS PCT: 8 % (ref 3–12)
NEUTROS ABS: 3.6 10*3/uL (ref 1.7–7.7)
Neutrophils Relative %: 61 % (ref 43–77)
Platelets: 287 10*3/uL (ref 150–400)
RBC: 4.25 MIL/uL (ref 3.87–5.11)
RDW: 13.7 % (ref 11.5–15.5)
WBC: 6 10*3/uL (ref 4.0–10.5)

## 2013-02-05 LAB — URINALYSIS W MICROSCOPIC + REFLEX CULTURE
BACTERIA UA: NONE SEEN
Bilirubin Urine: NEGATIVE
CRYSTALS: NONE SEEN
Casts: NONE SEEN
Glucose, UA: NEGATIVE mg/dL
Hgb urine dipstick: NEGATIVE
Ketones, ur: NEGATIVE mg/dL
Leukocytes, UA: NEGATIVE
NITRITE: NEGATIVE
Protein, ur: NEGATIVE mg/dL
RBC / HPF: NONE SEEN RBC/hpf (ref <3)
SQUAMOUS EPITHELIAL / LPF: NONE SEEN
UROBILINOGEN UA: 0.2 mg/dL (ref 0.0–1.0)
WBC, UA: NONE SEEN WBC/hpf (ref <3)
pH: 6 (ref 5.0–8.0)

## 2013-02-05 NOTE — Addendum Note (Signed)
Addended by: Richardson ChiquitoWILKINSON, KARI S on: 02/05/2013 10:20 AM   Modules accepted: Orders

## 2013-02-05 NOTE — Patient Instructions (Signed)

## 2013-02-05 NOTE — Progress Notes (Signed)
Annette Wallace 1965-03-18 [id number]    History:    Presents for annual exam.  Monthly cycles/BTL. History of left eye squamous [facility]. Ruptured disc, surgery 2013 doing better. Hypothyroid currently on no medication. Having some marital issues mostly related to stepdaughter with  drug abuse. Hypercholesteremia primary care manages. Requested labs today. Normal Pap and mammogram history, overdue for mammogram.  Past medical history, past surgical history, family history and social history were all reviewed and documented in the EPIC chart. Cleaning business. Daughter in nursing school hoping to do missionary work. Son working doing well. Quit smoking in 2011, has had several relapses  currently using a patch. Mother died of suicide/drug abuse, brother drug abuse.  ROS:  A  ROS was performed and pertinent positives and negatives are included.  Exam:  Filed Vitals:   02/05/13 0826  BP: 110/70    General appearance:  Normal, mild esophageal erythema, minimal lymph node enlargement on left neck Thyroid:  Symmetrical, normal in size, without palpable masses or nodularity. Respiratory  Auscultation:  Clear without wheezing or rhonchi Cardiovascular  Auscultation:  Regular rate, without rubs, murmurs or gallops  Edema/varicosities:  Not grossly evident Abdominal  Soft,nontender, without masses, guarding or rebound.  Liver/spleen:  No organomegaly noted  Hernia:  None appreciated  Skin  Inspection:  Grossly normal   Breasts: Examined lying and sitting.     Right: Without masses, retractions, discharge or axillary adenopathy.     Left: Without masses, retractions, discharge or axillary adenopathy. Gentitourinary   Inguinal/mons:  Normal without inguinal adenopathy  External genitalia:  Normal  BUS/Urethra/Skene's glands:  Normal  Vagina:  Normal  Cervix:  Normal  Uterus:   normal in size, shape and contour.  Midline and mobile  Adnexa/parametria:     Rt: Without  masses or tenderness.   Lt: Without masses or tenderness.  Anus and perineum: Normal  Digital rectal exam: Normal sphincter tone without palpated masses or tenderness  Assessment/Plan:  47 y.o.MWF G2P2  for annual exam with complaint of sore throat, daughter has mono.  Hypercholesteremia Monthly cycles/BTL Left eye squamous cell cancer followed by [facility] Rare smoking  Plan: CBC, lipid panel, glucose, TSH, Monospot, UA, Pap. Pap normal 02/2010, new screening guidelines reviewed.  Overdue for mammogram, reviewed importance of annual screen. SBE's, regular exercise, calcium rich diet, vitamin D 1000 daily encouraged. Continue decreasing/no smoking. Counseling as needed for home stressors.      Annette Wallace,Annette Wallace WHNP, 9:10 AM 02/05/2013

## 2013-02-06 ENCOUNTER — Other Ambulatory Visit: Payer: Self-pay | Admitting: Women's Health

## 2013-02-06 DIAGNOSIS — E785 Hyperlipidemia, unspecified: Secondary | ICD-10-CM

## 2013-02-06 LAB — MONONUCLEOSIS SCREEN: Mono Screen: NEGATIVE

## 2013-02-06 LAB — LIPID PANEL
CHOLESTEROL: 208 mg/dL — AB (ref 0–200)
HDL: 37 mg/dL — ABNORMAL LOW (ref >39)
LDL CALC: 150 mg/dL — AB (ref 0–99)
TRIGLYCERIDES: 107 mg/dL (ref <150)
Total CHOL/HDL Ratio: 5.6 Ratio
VLDL: 21 mg/dL (ref 0–40)

## 2013-02-06 LAB — GLUCOSE, RANDOM: Glucose, Bld: 87 mg/dL (ref 70–99)

## 2013-02-06 LAB — TSH: TSH: 0.786 u[IU]/mL (ref 0.350–4.500)

## 2013-02-12 ENCOUNTER — Encounter: Payer: Self-pay | Admitting: Women's Health

## 2013-08-07 ENCOUNTER — Other Ambulatory Visit: Payer: Self-pay | Admitting: Women's Health

## 2013-09-17 ENCOUNTER — Other Ambulatory Visit: Payer: Self-pay | Admitting: Cardiology

## 2013-09-17 DIAGNOSIS — Z139 Encounter for screening, unspecified: Secondary | ICD-10-CM

## 2013-09-23 ENCOUNTER — Inpatient Hospital Stay: Admission: RE | Admit: 2013-09-23 | Payer: BC Managed Care – PPO | Source: Ambulatory Visit

## 2013-10-02 ENCOUNTER — Inpatient Hospital Stay: Admission: RE | Admit: 2013-10-02 | Payer: BC Managed Care – PPO | Source: Ambulatory Visit

## 2013-10-24 ENCOUNTER — Telehealth: Payer: Self-pay | Admitting: *Deleted

## 2013-10-24 ENCOUNTER — Other Ambulatory Visit: Payer: Self-pay

## 2013-10-24 MED ORDER — NAPROXEN SODIUM 550 MG PO TABS: ORAL_TABLET | ORAL | Status: DC

## 2013-10-24 NOTE — Telephone Encounter (Signed)
Ok to refill 

## 2013-10-24 NOTE — Telephone Encounter (Signed)
Pt called requesting refill on Naproxen 550 mg. Okay to fill?

## 2013-10-24 NOTE — Telephone Encounter (Signed)
rx sent

## 2013-11-17 ENCOUNTER — Encounter: Payer: Self-pay | Admitting: Women's Health

## 2013-12-31 ENCOUNTER — Telehealth: Payer: Self-pay | Admitting: *Deleted

## 2013-12-31 NOTE — Telephone Encounter (Signed)
Pt called requesting increase on valtrex to 1,000 mg rather than 500 mg. Pt has outbreak. Okay to switch?

## 2013-12-31 NOTE — Telephone Encounter (Signed)
Okay, Valtrex 1000 twice daily when necessary #30 with 6 refills.

## 2014-01-01 MED ORDER — VALACYCLOVIR HCL 1 G PO TABS
1000.0000 mg | ORAL_TABLET | Freq: Two times a day (BID) | ORAL | Status: DC
Start: 2014-01-01 — End: 2017-07-17

## 2014-01-01 NOTE — Telephone Encounter (Signed)
Rx sent, pt informed. 

## 2014-01-30 ENCOUNTER — Other Ambulatory Visit: Payer: Self-pay | Admitting: Women's Health

## 2014-01-30 DIAGNOSIS — Z1231 Encounter for screening mammogram for malignant neoplasm of breast: Secondary | ICD-10-CM

## 2014-02-12 ENCOUNTER — Ambulatory Visit (HOSPITAL_COMMUNITY): Payer: Self-pay

## 2014-02-12 ENCOUNTER — Encounter: Payer: Self-pay | Admitting: Women's Health

## 2014-02-12 ENCOUNTER — Ambulatory Visit (HOSPITAL_COMMUNITY): Payer: BLUE CROSS/BLUE SHIELD | Attending: Women's Health

## 2014-03-02 ENCOUNTER — Ambulatory Visit (HOSPITAL_COMMUNITY): Payer: BLUE CROSS/BLUE SHIELD | Attending: Women's Health

## 2014-03-05 ENCOUNTER — Ambulatory Visit (HOSPITAL_COMMUNITY): Payer: BLUE CROSS/BLUE SHIELD

## 2014-03-05 ENCOUNTER — Encounter: Payer: Self-pay | Admitting: Women's Health

## 2014-03-11 ENCOUNTER — Encounter: Payer: Self-pay | Admitting: Women's Health

## 2016-01-31 DIAGNOSIS — F4322 Adjustment disorder with anxiety: Secondary | ICD-10-CM | POA: Diagnosis not present

## 2016-02-09 DIAGNOSIS — F4322 Adjustment disorder with anxiety: Secondary | ICD-10-CM | POA: Diagnosis not present

## 2016-02-16 DIAGNOSIS — F4322 Adjustment disorder with anxiety: Secondary | ICD-10-CM | POA: Diagnosis not present

## 2016-03-08 DIAGNOSIS — F4322 Adjustment disorder with anxiety: Secondary | ICD-10-CM | POA: Diagnosis not present

## 2016-05-31 ENCOUNTER — Encounter: Payer: Self-pay | Admitting: Gynecology

## 2017-01-19 ENCOUNTER — Encounter: Payer: Self-pay | Admitting: Gastroenterology

## 2017-02-11 DIAGNOSIS — J069 Acute upper respiratory infection, unspecified: Secondary | ICD-10-CM | POA: Diagnosis not present

## 2017-02-12 DIAGNOSIS — H524 Presbyopia: Secondary | ICD-10-CM | POA: Diagnosis not present

## 2017-02-12 DIAGNOSIS — H5203 Hypermetropia, bilateral: Secondary | ICD-10-CM | POA: Diagnosis not present

## 2017-02-12 DIAGNOSIS — H52223 Regular astigmatism, bilateral: Secondary | ICD-10-CM | POA: Diagnosis not present

## 2017-03-21 DIAGNOSIS — L720 Epidermal cyst: Secondary | ICD-10-CM | POA: Diagnosis not present

## 2017-03-21 DIAGNOSIS — L821 Other seborrheic keratosis: Secondary | ICD-10-CM | POA: Diagnosis not present

## 2017-03-21 DIAGNOSIS — L570 Actinic keratosis: Secondary | ICD-10-CM | POA: Diagnosis not present

## 2017-04-17 DIAGNOSIS — L720 Epidermal cyst: Secondary | ICD-10-CM | POA: Diagnosis not present

## 2017-07-03 ENCOUNTER — Telehealth: Payer: Self-pay | Admitting: *Deleted

## 2017-07-03 NOTE — Telephone Encounter (Signed)
Patient called has not been seen in 3 years, called c/o anxiety feeling, irregular cycles and hot flashes and night sweats, fatigue. She wanted labs drawn, I explained she would need OV for this. Pt states she has not seen a provider in 3 years including PCP because she has no problems until now. I told her best to see providers yearly. He main complaint is the emotional/anxiety, OV scheduled only for this and patient promised she would keep annual on 09/05/17.

## 2017-07-09 DIAGNOSIS — H01022 Squamous blepharitis right lower eyelid: Secondary | ICD-10-CM | POA: Diagnosis not present

## 2017-07-09 DIAGNOSIS — H01024 Squamous blepharitis left upper eyelid: Secondary | ICD-10-CM | POA: Diagnosis not present

## 2017-07-09 DIAGNOSIS — H01021 Squamous blepharitis right upper eyelid: Secondary | ICD-10-CM | POA: Diagnosis not present

## 2017-07-09 DIAGNOSIS — H01025 Squamous blepharitis left lower eyelid: Secondary | ICD-10-CM | POA: Diagnosis not present

## 2017-07-16 DIAGNOSIS — R87810 Cervical high risk human papillomavirus (HPV) DNA test positive: Secondary | ICD-10-CM

## 2017-07-16 DIAGNOSIS — R87610 Atypical squamous cells of undetermined significance on cytologic smear of cervix (ASC-US): Secondary | ICD-10-CM

## 2017-07-16 HISTORY — DX: Atypical squamous cells of undetermined significance on cytologic smear of cervix (ASC-US): R87.610

## 2017-07-16 HISTORY — DX: Cervical high risk human papillomavirus (HPV) DNA test positive: R87.810

## 2017-07-17 ENCOUNTER — Encounter: Payer: Self-pay | Admitting: Women's Health

## 2017-07-17 ENCOUNTER — Ambulatory Visit: Payer: BLUE CROSS/BLUE SHIELD | Admitting: Women's Health

## 2017-07-17 VITALS — BP 118/80 | Ht 65.0 in | Wt 149.0 lb

## 2017-07-17 DIAGNOSIS — R5383 Other fatigue: Secondary | ICD-10-CM | POA: Diagnosis not present

## 2017-07-17 DIAGNOSIS — Z01419 Encounter for gynecological examination (general) (routine) without abnormal findings: Secondary | ICD-10-CM

## 2017-07-17 DIAGNOSIS — R87610 Atypical squamous cells of undetermined significance on cytologic smear of cervix (ASC-US): Secondary | ICD-10-CM | POA: Diagnosis not present

## 2017-07-17 DIAGNOSIS — Z13220 Encounter for screening for lipoid disorders: Secondary | ICD-10-CM

## 2017-07-17 MED ORDER — ESTRADIOL-NORETHINDRONE ACET 0.5-0.1 MG PO TABS: 1.0000 | ORAL_TABLET | Freq: Every day | ORAL | 3 refills | Status: DC

## 2017-07-17 NOTE — Progress Notes (Signed)
Annette Wallace 11-Aug-1965 [id number]    History:    Presents for annual exam.  Irregular cycles for the past year, year prior cycles monthly.  No bleeding since  May with numerous hot flashes, mood changes.  Normal Pap history, overdue for mammogram, negative colonoscopy many years ago.  History of HSV with no outbreaks.  Anxiety on no medication.  Past medical history, past surgical history, family history and social history were all reviewed and documented in the EPIC chart.  Has a cleaning business.  2 children both doing well, daughter and nurse.  2 stepchildren's stepson doing well, stepdaughter addict, numerous arrests, DUIs, drug addicted.  ROS:  A ROS was performed and pertinent positives and negatives are included.  Exam:  Vitals:   07/17/17 1025  BP: 118/80   There is no height or weight on file to calculate BMI.   General appearance:  Normal Thyroid:  Symmetrical, normal in size, without palpable masses or nodularity. Respiratory  Auscultation:  Clear without wheezing or rhonchi Cardiovascular  Auscultation:  Regular rate, without rubs, murmurs or gallops  Edema/varicosities:  Not grossly evident Abdominal  Soft,nontender, without masses, guarding or rebound.  Liver/spleen:  No organomegaly noted  Hernia:  None appreciated  Skin  Inspection:  Grossly normal   Breasts: Examined lying and sitting.     Right: Without masses, retractions, discharge or axillary adenopathy.     Left: Without masses, retractions, discharge or axillary adenopathy. Gentitourinary   Inguinal/mons:  Normal without inguinal adenopathy  External genitalia:  Normal  BUS/Urethra/Skene's glands:  Normal  Vagina:  Normal  Cervix:  Normal  Uterus:   normal in size, shape and contour.  Midline and mobile  Adnexa/parametria:     Rt: Without masses or tenderness.   Lt: Without masses or tenderness.  Anus and perineum: Normal  Digital rectal exam: Normal sphincter tone without palpated masses or  tenderness  Assessment/Plan:  52 y.o. MWF G3, P2 for annual exam with menopausal symptoms.  Postmenopausal on no HRT no bleeding x3 months HSV no outbreaks Anxiety on no medication/stable  Plan: Menopause reviewed, options reviewed, vitamin E twice daily may help with hot flashes, options of HRT reviewed.  Reviewed risks of blood clots, strokes and breast cancer.  Would like to try a pill form, discussed cyclic progesterone and patches.  Activella 0.5 p.o. daily prescription, proper use given and reviewed.  Instructed to call problems with spotting or irregular bleeding.  SBE's, overdue for mammogram strongly encouraged to schedule, breast center information given reviewed importance of annual screening.  [facility] information given instructed to schedule screening colonoscopy.  Continue healthy lifestyle of regular exercise, regular activity, healthy diet, vitamin D 2000 daily encouraged..  Pap with HR HPV typing, new screening guidelines reviewed.  CBC, CMP, lipid panel, TSH.   Harrington Challengerancy J Rosendo Wallace [facility]ontWHNP, 11:27 AM 07/17/2017

## 2017-07-17 NOTE — Patient Instructions (Addendum)
Breast center [phone]    Menopause and Hormone Replacement Therapy What is hormone replacement therapy? Hormone replacement therapy (HRT) is the use of artificial (synthetic) hormones to replace hormones that your body stops producing during menopause. Menopause is the normal time of life when menstrual periods stop completely and the ovaries stop producing the female hormones estrogen and progesterone. This lack of hormones can affect your health and cause undesirable symptoms. HRT can relieve some of those symptoms. What are my options for HRT? HRT may consist of the synthetic hormones estrogen and progestin, or it may consist of only estrogen (estrogen-only therapy). You and your health care provider will decide which form of HRT is best for you. If you choose to be on HRT and you have a uterus, estrogen and progestin are usually prescribed. Estrogen-only therapy is used for women who do not have a uterus. Possible options for taking HRT include:  Pills.  Patches.  Gels.  Sprays.  Vaginal cream.  Vaginal rings.  Vaginal inserts.  The amount of hormone(s) that you take and how long you take the hormone(s) varies depending on your individual health. It is important to:  Begin HRT with the lowest possible dosage.  Stop HRT as soon as your health care provider tells you to stop.  Work with your health care provider so that you feel informed and comfortable with your decisions.  What are the benefits of HRT? HRT can reduce the frequency and severity of menopausal symptoms. Benefits of HRT vary depending on the menopausal symptoms that you have, the severity of your symptoms, and your overall health. HRT may help to improve the following menopausal symptoms:  Hot flashes and night sweats. These are sudden feelings of heat that spread over the face and body. The skin may turn red, like a blush. Night sweats are hot flashes that happen while you are sleeping or trying to  sleep.  Bone loss (osteoporosis). The body loses calcium more quickly after menopause, causing the bones to become weaker. This can increase the risk for bone breaks (fractures).  Vaginal dryness. The lining of the vagina can become thin and dry, which can cause pain during sexual intercourse or cause infection, burning, or itching.  Urinary tract infections.  Urinary incontinence. This is a decreased ability to control when you urinate.  Irritability.  Short-term memory problems.  What are the risks of HRT? Risks of HRT vary depending on your individual health and medical history. Risks of HRT also depend on whether you receive both estrogen and progestin or you receive estrogen only.HRT may increase the risk of:  Spotting. This is when a small amount of bloodleaks from the vagina unexpectedly.  Endometrial cancer. This cancer is in the lining of the uterus (endometrium).  Breast cancer.  Increased density of breast tissue. This can make it harder to find breast cancer on a breast X-ray (mammogram).  Stroke.  Heart attack.  Blood clots.  Gallbladder disease.  Risks of HRT can increase if you have any of the following conditions:  Endometrial cancer.  Liver disease.  Heart disease.  Breast cancer.  History of blood clots.  History of stroke.  How should I care for myself while I am on HRT?  Take over-the-counter and prescription medicines only as told by your health care provider.  Get mammograms, pelvic exams, and medical checkups as often as told by your health care provider.  Have Pap tests done as often as told by your health care provider. A Pap  test is sometimes called a Pap smear. It is a screening test that is used to check for signs of cancer of the cervix and vagina. A Pap test can also identify the presence of infection or precancerous changes. Pap tests may be done: ? Every 3 years, starting at age 67. ? Every 5 years, starting after age 70, in  combination with testing for human papillomavirus (HPV). ? More often or less often depending on other medical conditions you have, your age, and other risk factors.  It is your responsibility to get your Pap test results. Ask your health care provider or the department performing the test when your results will be ready.  Keep all follow-up visits as told by your health care provider. This is important. When should I seek medical care? Talk with your health care provider if:  You have any of these: ? Pain or swelling in your legs. ? Shortness of breath. ? Chest pain. ? Lumps or changes in your breasts or armpits. ? Slurred speech. ? Pain, burning, or bleeding when you urine.  You develop any of these: ? Unusual vaginal bleeding. ? Dizziness or headaches. ? Weakness or numbness in any part of your arms or legs. ? Pain in your abdomen.  This information is not intended to replace advice given to you by your health care provider. Make sure you discuss any questions you have with your health care provider. Document Released: 10/01/2002 Document Revised: 11/30/2015 Document Reviewed: 07/06/2014 Elsevier Interactive Patient Education  2017 [place].  Colonoscopy  Dr [facility]  [phone]  Health Maintenance for Postmenopausal Women Menopause is a normal process in which your reproductive ability comes to an end. This process happens gradually over a span of months to years, usually between the ages of 78 and 74. Menopause is complete when you have missed 12 consecutive menstrual periods. It is important to talk with your health care provider about some of the most common conditions that affect postmenopausal women, such as heart disease, cancer, and bone loss (osteoporosis). Adopting a healthy lifestyle and getting preventive care can help to promote your health and wellness. Those actions can also lower your chances of developing some of these common conditions. What should I  know about menopause? During menopause, you may experience a number of symptoms, such as:  Moderate-to-severe hot flashes.  Night sweats.  Decrease in sex drive.  Mood swings.  Headaches.  Tiredness.  Irritability.  Memory problems.  Insomnia.  Choosing to treat or not to treat menopausal changes is an individual decision that you make with your health care provider. What should I know about hormone replacement therapy and supplements? Hormone therapy products are effective for treating symptoms that are associated with menopause, such as hot flashes and night sweats. Hormone replacement carries certain risks, especially as you become older. If you are thinking about using estrogen or estrogen with progestin treatments, discuss the benefits and risks with your health care provider. What should I know about heart disease and stroke? Heart disease, heart attack, and stroke become more likely as you age. This may be due, in part, to the hormonal changes that your body experiences during menopause. These can affect how your body processes dietary fats, triglycerides, and cholesterol. Heart attack and stroke are both medical emergencies. There are many things that you can do to help prevent heart disease and stroke:  Have your blood pressure checked at least every 1-2 years. High blood pressure causes heart disease and increases  the risk of stroke.  If you are 95-64 years old, ask your health care provider if you should take aspirin to prevent a heart attack or a stroke.  Do not use any tobacco products, including cigarettes, chewing tobacco, or electronic cigarettes. If you need help quitting, ask your health care provider.  It is important to eat a healthy diet and maintain a healthy weight. ? Be sure to include plenty of vegetables, fruits, low-fat dairy products, and lean protein. ? Avoid eating foods that are high in solid fats, added sugars, or salt (sodium).  Get regular  exercise. This is one of the most important things that you can do for your health. ? Try to exercise for at least 150 minutes each week. The type of exercise that you do should increase your heart rate and make you sweat. This is known as moderate-intensity exercise. ? Try to do strengthening exercises at least twice each week. Do these in addition to the moderate-intensity exercise.  Know your numbers.Ask your health care provider to check your cholesterol and your blood glucose. Continue to have your blood tested as directed by your health care provider.  What should I know about cancer screening? There are several types of cancer. Take the following steps to reduce your risk and to catch any cancer development as early as possible. Breast Cancer  Practice breast self-awareness. ? This means understanding how your breasts normally appear and feel. ? It also means doing regular breast self-exams. Let your health care provider know about any changes, no matter how small.  If you are 77 or older, have a clinician do a breast exam (clinical breast exam or CBE) every year. Depending on your age, family history, and medical history, it may be recommended that you also have a yearly breast X-ray (mammogram).  If you have a family history of breast cancer, talk with your health care provider about genetic screening.  If you are at high risk for breast cancer, talk with your health care provider about having an MRI and a mammogram every year.  Breast cancer (BRCA) gene test is recommended for women who have family members with BRCA-related cancers. Results of the assessment will determine the need for genetic counseling and BRCA1 and for BRCA2 testing. BRCA-related cancers include these types: ? Breast. This occurs in males or females. ? Ovarian. ? Tubal. This may also be called fallopian tube cancer. ? Cancer of the abdominal or pelvic lining (peritoneal  cancer). ? Prostate. ? Pancreatic.  Cervical, Uterine, and Ovarian Cancer Your health care provider may recommend that you be screened regularly for cancer of the pelvic organs. These include your ovaries, uterus, and vagina. This screening involves a pelvic exam, which includes checking for microscopic changes to the surface of your cervix (Pap test).  For women ages 21-65, health care providers may recommend a pelvic exam and a Pap test every three years. For women ages 33-65, they may recommend the Pap test and pelvic exam, combined with testing for human papilloma virus (HPV), every five years. Some types of HPV increase your risk of cervical cancer. Testing for HPV may also be done on women of any age who have unclear Pap test results.  Other health care providers may not recommend any screening for nonpregnant women who are considered low risk for pelvic cancer and have no symptoms. Ask your health care provider if a screening pelvic exam is right for you.  If you have had past treatment for cervical cancer  or a condition that could lead to cancer, you need Pap tests and screening for cancer for at least 20 years after your treatment. If Pap tests have been discontinued for you, your risk factors (such as having a new sexual partner) need to be reassessed to determine if you should start having screenings again. Some women have medical problems that increase the chance of getting cervical cancer. In these cases, your health care provider may recommend that you have screening and Pap tests more often.  If you have a family history of uterine cancer or ovarian cancer, talk with your health care provider about genetic screening.  If you have vaginal bleeding after reaching menopause, tell your health care provider.  There are currently no reliable tests available to screen for ovarian cancer.  Lung Cancer Lung cancer screening is recommended for adults 25-55 years old who are at high risk for  lung cancer because of a history of smoking. A yearly low-dose CT scan of the lungs is recommended if you:  Currently smoke.  Have a history of at least 30 pack-years of smoking and you currently smoke or have quit within the past 15 years. A pack-year is smoking an average of one pack of cigarettes per day for one year.  Yearly screening should:  Continue until it has been 15 years since you quit.  Stop if you develop a health problem that would prevent you from having lung cancer treatment.  Colorectal Cancer  This type of cancer can be detected and can often be prevented.  Routine colorectal cancer screening usually begins at age 26 and continues through age 47.  If you have risk factors for colon cancer, your health care provider may recommend that you be screened at an earlier age.  If you have a family history of colorectal cancer, talk with your health care provider about genetic screening.  Your health care provider may also recommend using home test kits to check for hidden blood in your stool.  A small camera at the end of a tube can be used to examine your colon directly (sigmoidoscopy or colonoscopy). This is done to check for the earliest forms of colorectal cancer.  Direct examination of the colon should be repeated every 5-10 years until age 29. However, if early forms of precancerous polyps or small growths are found or if you have a family history or genetic risk for colorectal cancer, you may need to be screened more often.  Skin Cancer  Check your skin from head to toe regularly.  Monitor any moles. Be sure to tell your health care provider: ? About any new moles or changes in moles, especially if there is a change in a mole's shape or color. ? If you have a mole that is larger than the size of a pencil eraser.  If any of your family members has a history of skin cancer, especially at a Yobani Schertzer age, talk with your health care provider about genetic  screening.  Always use sunscreen. Apply sunscreen liberally and repeatedly throughout the day.  Whenever you are outside, protect yourself by wearing long sleeves, pants, a wide-brimmed hat, and sunglasses.  What should I know about osteoporosis? Osteoporosis is a condition in which bone destruction happens more quickly than new bone creation. After menopause, you may be at an increased risk for osteoporosis. To help prevent osteoporosis or the bone fractures that can happen because of osteoporosis, the following is recommended:  If you are 33-75 years old, get  at least 1,000 mg of calcium and at least 600 mg of vitamin D per day.  If you are older than age 48 but younger than age 29, get at least 1,200 mg of calcium and at least 600 mg of vitamin D per day.  If you are older than age 22, get at least 1,200 mg of calcium and at least 800 mg of vitamin D per day.  Smoking and excessive alcohol intake increase the risk of osteoporosis. Eat foods that are rich in calcium and vitamin D, and do weight-bearing exercises several times each week as directed by your health care provider. What should I know about how menopause affects my mental health? Depression may occur at any age, but it is more common as you become older. Common symptoms of depression include:  Low or sad mood.  Changes in sleep patterns.  Changes in appetite or eating patterns.  Feeling an overall lack of motivation or enjoyment of activities that you previously enjoyed.  Frequent crying spells.  Talk with your health care provider if you think that you are experiencing depression. What should I know about immunizations? It is important that you get and maintain your immunizations. These include:  Tetanus, diphtheria, and pertussis (Tdap) booster vaccine.  Influenza every year before the flu season begins.  Pneumonia vaccine.  Shingles vaccine.  Your health care provider may also recommend other  immunizations. This information is not intended to replace advice given to you by your health care provider. Make sure you discuss any questions you have with your health care provider. Document Released: 02/24/2005 Document Revised: 07/23/2015 Document Reviewed: 10/06/2014 Elsevier Interactive Patient Education  2018 [employer].

## 2017-07-18 ENCOUNTER — Other Ambulatory Visit: Payer: BLUE CROSS/BLUE SHIELD

## 2017-07-18 DIAGNOSIS — R5383 Other fatigue: Secondary | ICD-10-CM | POA: Diagnosis not present

## 2017-07-18 DIAGNOSIS — Z01419 Encounter for gynecological examination (general) (routine) without abnormal findings: Secondary | ICD-10-CM | POA: Diagnosis not present

## 2017-07-18 DIAGNOSIS — Z13220 Encounter for screening for lipoid disorders: Secondary | ICD-10-CM | POA: Diagnosis not present

## 2017-07-18 LAB — LIPID PANEL
Cholesterol: 210 mg/dL — ABNORMAL HIGH (ref <200)
HDL: 40 mg/dL — ABNORMAL LOW (ref >50)
LDL Cholesterol (Calc): 142 mg/dL (calc) — ABNORMAL HIGH
NON-HDL CHOLESTEROL (CALC): 170 mg/dL — AB (ref <130)
Total CHOL/HDL Ratio: 5.3 (calc) — ABNORMAL HIGH (ref <5.0)
Triglycerides: 152 mg/dL — ABNORMAL HIGH (ref <150)

## 2017-07-18 LAB — CBC WITH DIFFERENTIAL/PLATELET
BASOS ABS: 106 {cells}/uL (ref 0–200)
Basophils Relative: 1.4 %
EOS ABS: 198 {cells}/uL (ref 15–500)
EOS PCT: 2.6 %
HCT: 39.4 % (ref 35.0–45.0)
Hemoglobin: 13.5 g/dL (ref 11.7–15.5)
Lymphs Abs: 1991 cells/uL (ref 850–3900)
MCH: 30.5 pg (ref 27.0–33.0)
MCHC: 34.3 g/dL (ref 32.0–36.0)
MCV: 88.9 fL (ref 80.0–100.0)
MONOS PCT: 9.7 %
MPV: 11.3 fL (ref 7.5–12.5)
NEUTROS PCT: 60.1 %
Neutro Abs: 4568 cells/uL (ref 1500–7800)
Platelets: 246 10*3/uL (ref 140–400)
RBC: 4.43 10*6/uL (ref 3.80–5.10)
RDW: 11.9 % (ref 11.0–15.0)
Total Lymphocyte: 26.2 %
WBC mixed population: 737 cells/uL (ref 200–950)
WBC: 7.6 10*3/uL (ref 3.8–10.8)

## 2017-07-18 LAB — COMPREHENSIVE METABOLIC PANEL
AG Ratio: 1.8 (calc) (ref 1.0–2.5)
ALT: 18 U/L (ref 6–29)
AST: 17 U/L (ref 10–35)
Albumin: 4.3 g/dL (ref 3.6–5.1)
Alkaline phosphatase (APISO): 77 U/L (ref 33–130)
BILIRUBIN TOTAL: 0.4 mg/dL (ref 0.2–1.2)
BUN: 14 mg/dL (ref 7–25)
CALCIUM: 9.5 mg/dL (ref 8.6–10.4)
CHLORIDE: 104 mmol/L (ref 98–110)
CO2: 26 mmol/L (ref 20–32)
Creat: 0.77 mg/dL (ref 0.50–1.05)
GLUCOSE: 93 mg/dL (ref 65–99)
Globulin: 2.4 g/dL (calc) (ref 1.9–3.7)
Potassium: 4.3 mmol/L (ref 3.5–5.3)
SODIUM: 138 mmol/L (ref 135–146)
Total Protein: 6.7 g/dL (ref 6.1–8.1)

## 2017-07-18 LAB — TSH: TSH: 0.6 m[IU]/L

## 2017-07-20 LAB — PAP, TP IMAGING W/ HPV RNA, RFLX HPV TYPE 16,18/45: HPV DNA HIGH RISK: DETECTED — AB

## 2017-07-30 DIAGNOSIS — N959 Unspecified menopausal and perimenopausal disorder: Secondary | ICD-10-CM | POA: Diagnosis not present

## 2017-07-30 DIAGNOSIS — M79676 Pain in unspecified toe(s): Secondary | ICD-10-CM | POA: Diagnosis not present

## 2017-08-16 ENCOUNTER — Encounter: Payer: Self-pay | Admitting: Gynecology

## 2017-08-16 ENCOUNTER — Ambulatory Visit: Payer: BLUE CROSS/BLUE SHIELD | Admitting: Gynecology

## 2017-08-16 VITALS — BP 132/82

## 2017-08-16 DIAGNOSIS — R87810 Cervical high risk human papillomavirus (HPV) DNA test positive: Secondary | ICD-10-CM

## 2017-08-16 DIAGNOSIS — N72 Inflammatory disease of cervix uteri: Secondary | ICD-10-CM | POA: Diagnosis not present

## 2017-08-16 DIAGNOSIS — R87610 Atypical squamous cells of undetermined significance on cytologic smear of cervix (ASC-US): Secondary | ICD-10-CM | POA: Diagnosis not present

## 2017-08-16 NOTE — Progress Notes (Signed)
    Annette HoltsDianna L Wallace [birth year] [id number]        52 y.o.  [id number] presents with first abnormal Pap smear showing ASCUS positive high risk HPV.  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  Past medical history,surgical history, problem list, medications, allergies, family history and social history were all reviewed and documented in the EPIC chart.  Directed ROS with pertinent positives and negatives documented in the history of present illness/assessment and plan.  Exam: Bari MantisKim Alexis assistant Vitals:   08/16/17 1036  BP: 132/82   General appearance:  Normal Abdomen soft nontender without masses guarding rebound Pelvic external BUS vagina normal with atrophic changes.  Cervix grossly normal with atrophic changes.  Uterus normal size midline mobile nontender.  Adnexa without masses or tenderness.  Colposcopy performed after acetic acid cleanse is inadequate with no transformation zone seen.  No abnormalities were seen.  ECC performed.  Patient tolerated well.  Physical Exam  Genitourinary:       Assessment/Plan:  52 y.o. [id number] with first abnormal Pap smear showing ASCUS positive high risk HPV.  The patient and I discussed in detail both cervical atypia and positive high risk HPV.  We discussed dysplasia, high-grade/low-grade, progression/regression.  I also reviewed that unclear whether this has any association with her squamous cell carcinoma of the eye as I am unsure whether this is an HPV related issue.  Her colposcopy today is normal but inadequate no transformation zone was seen consistent with her postmenopausal status.  ECC was performed patient.  Patient will follow-up for results and assuming negative then plan follow-up Pap smear/HPV 1 year.    Dara Lordsimothy P Hanif Radin MD, 11:00 AM 08/16/2017

## 2017-08-16 NOTE — Patient Instructions (Signed)
Office will call with biopsy results 

## 2017-08-20 ENCOUNTER — Encounter: Payer: Self-pay | Admitting: Gynecology

## 2017-08-20 LAB — PATHOLOGY

## 2017-08-20 LAB — TISSUE SPECIMEN

## 2017-09-05 ENCOUNTER — Ambulatory Visit: Payer: BLUE CROSS/BLUE SHIELD | Admitting: Women's Health

## 2017-11-13 DIAGNOSIS — L570 Actinic keratosis: Secondary | ICD-10-CM | POA: Diagnosis not present

## 2017-11-13 DIAGNOSIS — L578 Other skin changes due to chronic exposure to nonionizing radiation: Secondary | ICD-10-CM | POA: Diagnosis not present

## 2017-12-07 DIAGNOSIS — H16101 Unspecified superficial keratitis, right eye: Secondary | ICD-10-CM | POA: Diagnosis not present

## 2017-12-07 DIAGNOSIS — H04123 Dry eye syndrome of bilateral lacrimal glands: Secondary | ICD-10-CM | POA: Diagnosis not present

## 2018-05-06 DIAGNOSIS — R5381 Other malaise: Secondary | ICD-10-CM | POA: Diagnosis not present

## 2018-05-06 DIAGNOSIS — M25519 Pain in unspecified shoulder: Secondary | ICD-10-CM | POA: Diagnosis not present

## 2018-05-08 DIAGNOSIS — R5381 Other malaise: Secondary | ICD-10-CM | POA: Diagnosis not present

## 2018-05-15 DIAGNOSIS — M7502 Adhesive capsulitis of left shoulder: Secondary | ICD-10-CM | POA: Diagnosis not present

## 2018-05-17 DIAGNOSIS — M25512 Pain in left shoulder: Secondary | ICD-10-CM | POA: Diagnosis not present

## 2018-06-04 DIAGNOSIS — M75112 Incomplete rotator cuff tear or rupture of left shoulder, not specified as traumatic: Secondary | ICD-10-CM | POA: Diagnosis not present

## 2018-06-04 DIAGNOSIS — N951 Menopausal and female climacteric states: Secondary | ICD-10-CM | POA: Diagnosis not present

## 2018-06-06 DIAGNOSIS — M75112 Incomplete rotator cuff tear or rupture of left shoulder, not specified as traumatic: Secondary | ICD-10-CM | POA: Diagnosis not present

## 2018-06-06 DIAGNOSIS — Z72820 Sleep deprivation: Secondary | ICD-10-CM | POA: Diagnosis not present

## 2018-06-06 DIAGNOSIS — R6882 Decreased libido: Secondary | ICD-10-CM | POA: Diagnosis not present

## 2018-06-06 DIAGNOSIS — N951 Menopausal and female climacteric states: Secondary | ICD-10-CM | POA: Diagnosis not present

## 2018-06-06 DIAGNOSIS — E559 Vitamin D deficiency, unspecified: Secondary | ICD-10-CM | POA: Diagnosis not present

## 2018-06-12 DIAGNOSIS — M75112 Incomplete rotator cuff tear or rupture of left shoulder, not specified as traumatic: Secondary | ICD-10-CM | POA: Diagnosis not present

## 2018-06-18 DIAGNOSIS — M75112 Incomplete rotator cuff tear or rupture of left shoulder, not specified as traumatic: Secondary | ICD-10-CM | POA: Diagnosis not present

## 2018-07-02 DIAGNOSIS — R6882 Decreased libido: Secondary | ICD-10-CM | POA: Diagnosis not present

## 2018-07-02 DIAGNOSIS — N951 Menopausal and female climacteric states: Secondary | ICD-10-CM | POA: Diagnosis not present

## 2018-07-02 DIAGNOSIS — Z72820 Sleep deprivation: Secondary | ICD-10-CM | POA: Diagnosis not present

## 2018-07-02 DIAGNOSIS — E559 Vitamin D deficiency, unspecified: Secondary | ICD-10-CM | POA: Diagnosis not present

## 2018-07-17 ENCOUNTER — Encounter: Payer: Self-pay | Admitting: Women's Health

## 2018-07-17 DIAGNOSIS — R922 Inconclusive mammogram: Secondary | ICD-10-CM | POA: Diagnosis not present

## 2018-07-17 DIAGNOSIS — R921 Mammographic calcification found on diagnostic imaging of breast: Secondary | ICD-10-CM | POA: Diagnosis not present

## 2018-07-23 DIAGNOSIS — N951 Menopausal and female climacteric states: Secondary | ICD-10-CM | POA: Diagnosis not present

## 2018-07-23 DIAGNOSIS — R5383 Other fatigue: Secondary | ICD-10-CM | POA: Diagnosis not present

## 2018-07-23 DIAGNOSIS — Z72820 Sleep deprivation: Secondary | ICD-10-CM | POA: Diagnosis not present

## 2018-08-05 ENCOUNTER — Encounter: Payer: Self-pay | Admitting: Women's Health

## 2018-08-07 ENCOUNTER — Encounter: Payer: Self-pay | Admitting: Women's Health

## 2018-10-09 ENCOUNTER — Encounter: Payer: Self-pay | Admitting: Gynecology

## 2019-01-31 DIAGNOSIS — Z20828 Contact with and (suspected) exposure to other viral communicable diseases: Secondary | ICD-10-CM | POA: Diagnosis not present

## 2019-01-31 DIAGNOSIS — R519 Headache, unspecified: Secondary | ICD-10-CM | POA: Diagnosis not present

## 2019-01-31 DIAGNOSIS — J3489 Other specified disorders of nose and nasal sinuses: Secondary | ICD-10-CM | POA: Diagnosis not present

## 2019-03-04 DIAGNOSIS — B0221 Postherpetic geniculate ganglionitis: Secondary | ICD-10-CM | POA: Diagnosis not present

## 2019-03-04 DIAGNOSIS — J029 Acute pharyngitis, unspecified: Secondary | ICD-10-CM | POA: Diagnosis not present

## 2019-05-25 DIAGNOSIS — Z20822 Contact with and (suspected) exposure to covid-19: Secondary | ICD-10-CM | POA: Diagnosis not present

## 2019-08-14 DIAGNOSIS — J029 Acute pharyngitis, unspecified: Secondary | ICD-10-CM | POA: Diagnosis not present

## 2019-08-14 DIAGNOSIS — Z20828 Contact with and (suspected) exposure to other viral communicable diseases: Secondary | ICD-10-CM | POA: Diagnosis not present

## 2019-08-14 DIAGNOSIS — R070 Pain in throat: Secondary | ICD-10-CM | POA: Diagnosis not present

## 2019-08-14 DIAGNOSIS — R519 Headache, unspecified: Secondary | ICD-10-CM | POA: Diagnosis not present

## 2019-08-14 DIAGNOSIS — R531 Weakness: Secondary | ICD-10-CM | POA: Diagnosis not present

## 2019-11-19 DIAGNOSIS — H524 Presbyopia: Secondary | ICD-10-CM | POA: Diagnosis not present

## 2020-04-12 ENCOUNTER — Other Ambulatory Visit: Payer: Self-pay | Admitting: Family Medicine

## 2020-04-12 DIAGNOSIS — R1084 Generalized abdominal pain: Secondary | ICD-10-CM

## 2020-05-03 ENCOUNTER — Other Ambulatory Visit: Payer: BLUE CROSS/BLUE SHIELD

## 2020-05-12 ENCOUNTER — Ambulatory Visit
Admission: RE | Admit: 2020-05-12 | Discharge: 2020-05-12 | Disposition: A | Discharge status: Home / Self Care | Payer: BLUE CROSS/BLUE SHIELD | Source: Ambulatory Visit | Attending: Family Medicine | Admitting: Family Medicine

## 2020-05-12 DIAGNOSIS — R1084 Generalized abdominal pain: Secondary | ICD-10-CM

## 2020-06-02 ENCOUNTER — Other Ambulatory Visit: Payer: Self-pay | Admitting: Family Medicine

## 2020-06-02 ENCOUNTER — Ambulatory Visit
Admission: RE | Admit: 2020-06-02 | Discharge: 2020-06-02 | Disposition: A | Discharge status: Home / Self Care | Payer: BLUE CROSS/BLUE SHIELD | Source: Ambulatory Visit | Attending: Family Medicine | Admitting: Family Medicine

## 2020-06-02 DIAGNOSIS — R1084 Generalized abdominal pain: Secondary | ICD-10-CM

## 2020-06-02 DIAGNOSIS — R102 Pelvic and perineal pain: Secondary | ICD-10-CM

## 2020-06-07 ENCOUNTER — Ambulatory Visit
Admission: RE | Admit: 2020-06-07 | Discharge: 2020-06-07 | Disposition: A | Discharge status: Home / Self Care | Payer: BLUE CROSS/BLUE SHIELD | Source: Ambulatory Visit | Attending: Family Medicine | Admitting: Family Medicine

## 2020-06-07 ENCOUNTER — Other Ambulatory Visit: Payer: BLUE CROSS/BLUE SHIELD

## 2020-06-07 DIAGNOSIS — R102 Pelvic and perineal pain: Secondary | ICD-10-CM

## 2020-06-08 ENCOUNTER — Other Ambulatory Visit: Payer: Self-pay | Admitting: Family Medicine

## 2020-06-08 DIAGNOSIS — N9489 Other specified conditions associated with female genital organs and menstrual cycle: Secondary | ICD-10-CM

## 2020-06-18 ENCOUNTER — Ambulatory Visit
Admission: RE | Admit: 2020-06-18 | Discharge: 2020-06-18 | Disposition: A | Discharge status: Home / Self Care | Payer: BLUE CROSS/BLUE SHIELD | Source: Ambulatory Visit | Attending: Family Medicine | Admitting: Family Medicine

## 2020-06-18 DIAGNOSIS — N9489 Other specified conditions associated with female genital organs and menstrual cycle: Secondary | ICD-10-CM

## 2020-06-18 MED ORDER — GADOBENATE DIMEGLUMINE 529 MG/ML IV SOLN
20.0000 mL | Freq: Once | INTRAVENOUS | Status: AC | PRN
  Administered 2020-06-18: 20 mL via INTRAVENOUS

## 2020-06-25 ENCOUNTER — Ambulatory Visit: Payer: BLUE CROSS/BLUE SHIELD | Admitting: Obstetrics and Gynecology

## 2020-06-25 ENCOUNTER — Other Ambulatory Visit (HOSPITAL_COMMUNITY)
Admission: RE | Admit: 2020-06-25 | Discharge: 2020-06-25 | Disposition: A | Discharge status: Home / Self Care | Payer: BLUE CROSS/BLUE SHIELD | Source: Ambulatory Visit | Attending: Obstetrics and Gynecology | Admitting: Obstetrics and Gynecology

## 2020-06-25 ENCOUNTER — Other Ambulatory Visit: Payer: Self-pay

## 2020-06-25 ENCOUNTER — Encounter: Payer: Self-pay | Admitting: Obstetrics and Gynecology

## 2020-06-25 VITALS — BP 160/98 | HR 80 | Ht 65.0 in | Wt 150.7 lb

## 2020-06-25 DIAGNOSIS — Z124 Encounter for screening for malignant neoplasm of cervix: Secondary | ICD-10-CM | POA: Insufficient documentation

## 2020-06-25 DIAGNOSIS — Z01818 Encounter for other preprocedural examination: Secondary | ICD-10-CM | POA: Diagnosis not present

## 2020-06-25 DIAGNOSIS — R102 Pelvic and perineal pain: Secondary | ICD-10-CM

## 2020-06-25 DIAGNOSIS — R1900 Intra-abdominal and pelvic swelling, mass and lump, unspecified site: Secondary | ICD-10-CM

## 2020-06-25 DIAGNOSIS — Z7689 Persons encountering health services in other specified circumstances: Secondary | ICD-10-CM

## 2020-06-25 DIAGNOSIS — R030 Elevated blood-pressure reading, without diagnosis of hypertension: Secondary | ICD-10-CM

## 2020-06-25 NOTE — Progress Notes (Signed)
Pt present ultrasound results. Pt stated that she was in a lot of pain. Pt's bp was elevated during both manual bp checks.

## 2020-06-25 NOTE — Progress Notes (Signed)
GYNECOLOGY PROGRESS NOTE  Subjective:    Patient ID: Annette Wallace, female    DOB: 1965/08/01, 55 y.o.   MRN: [id number]  HPI  Patient is a 55 y.o. [id number] postmenopausal female who presents as a referral from her primary care provider Meridee Score, FNP at [place] [facility] in [place]) for pelvic mass noted on ultrasound.  Patient reports that she has been experiencing pelvic pain for several months.  She reports that it was initially intermittent, sharp, stabbing, however as continue to become worse in intensity.  She has also been experiencing left groin pain as well.  Was evaluated by her PCP and had an ultrasound performed which noted a complex left adnexal mass and recommended MRI evaluation.  Patient reports that she had the MRI which still noted the mass present however differential diagnosis also included diverticular abscess.  She reports that she was prescribed antibiotics, however has not taken them as of yet as she desired a second opinion.   Gynecologic History:  Last pap smear: 07/17/2017, ASCUS HR HPV positive. S/p neg colposcopy Last mammogram: 07/17/2018.  Results were: normal. Last colonoscopy: 12/31/2016. Results were: normal.     OB History  Gravida Para Term Preterm AB Living  3 2 2   1 2   SAB IAB Ectopic Multiple Live Births    1 0   2    # Outcome Date GA Lbr Len/2nd Weight Sex Delivery Anes PTL Lv  3 Term 1991   6 lb 3 oz (2.807 kg) F CS-Unspec Spinal, EPI  LIV  2 Term 1989   7 lb 6 oz (3.345 kg) M Vag-Spont Spinal, EPI  LIV  1 IAB             Past Medical History:  Diagnosis Date   Anxiety    ASCUS with positive high risk HPV cervical 07/2017   Colposcopy negative.  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   Reflux    GERD   Squamous cell carcinoma 06/2007   OF LEFT EYE     Past Surgical History:  Procedure Laterality Date   CERVICAL  SPINE SURGERY  2-13   RUPTURED DISCS   TUBAL LIGATION     WIDE EXC OF SQ CELL A OF EYE     LEFT EYE--ALSO, CRYOTHERAPY TO THE MARGINS     Review of Systems Constitutional: negative for chills, fatigue, fevers and sweats Eyes: negative for irritation, redness and visual disturbance Ears, nose, mouth, throat, and face: negative for hearing loss, nasal congestion, snoring and tinnitus Respiratory: negative for asthma, cough, sputum Cardiovascular: negative for chest pain, dyspnea, exertional chest pressure/discomfort, irregular heart beat, palpitations and syncope Gastrointestinal: negative for abdominal pain, change in bowel habits, nausea and vomiting Genitourinary: negative for abnormal menstrual periods, genital lesions, sexual problems and vaginal discharge, dysuria and urinary incontinence. Positive for pelvic pain (see HPI).  Integument/breast: negative for breast lump, breast tenderness and nipple discharge Hematologic/lymphatic: negative for bleeding and easy bruising Musculoskeletal:negative for back pain and muscle weakness Neurological: negative for dizziness, headaches, vertigo and weakness Endocrine: negative for diabetic symptoms including polydipsia, polyuria and skin dryness Allergic/Immunologic: negative for hay fever and urticaria      Objective:   Blood pressure (!) 160/98, pulse 80, height 5\' 5"  (1.651 m), weight 150 lb 11.2 oz (68.4 kg), last menstrual period 05/30/2017. General appearance: alert and  no distress Abdomen: normal findings: no masses palpable, no organomegaly, and soft and abnormal findings:  guarding and mild to moderate tenderness in the LLQ Pelvic: external genitalia normal, rectovaginal septum normal.  Vagina without discharge.  Cervix normal appearing, no lesions and no motion tenderness.  Uterus mobile, nontender, normal shape and size.  Adnexae non-palpable bilaterally, mildly tender on left.  Extremities: extremities normal, atraumatic, no  cyanosis or edema Neurologic: Grossly normal   Labs:  Records reviewed from PCP, last labs noted 04/07/2020.   Imaging:  CLINICAL DATA:  Initial evaluation for left lower quadrant pelvic pain for 1 month.   EXAM: TRANSABDOMINAL AND TRANSVAGINAL ULTRASOUND OF PELVIS   TECHNIQUE: Both transabdominal and transvaginal ultrasound examinations of the pelvis were performed. Transabdominal technique was performed for global imaging of the pelvis including uterus, ovaries, adnexal regions, and pelvic cul-de-sac. It was necessary to proceed with endovaginal exam following the transabdominal exam to visualize the uterus, endometrium, and ovaries.   COMPARISON:  None available.   FINDINGS: Uterus   Measurements: 7.2 x 3.6 x 4.5 cm = volume: 60.4 mL. Uterus is anteverted. Heterogeneous echotexture seen within the uterine myometrium without discrete fibroid or other mass.   Endometrium   Thickness: 4.8 mm. Two adjacent subcentimeter echogenic foci in noted within the endometrial complex, likely small microcalcifications, of doubtful significance. No other focal abnormality.   Right ovary   Not visualized.  No adnexal mass.   Left ovary   Measurements: 5.1 x 4.0 x 4.8 cm = volume: 52 mL. Complex cystic lesion measuring approximately 4.3 x 2.7 x 3.7 cm seen arising from the left ovary. Lesion is complex in appearance with possible internal solid components and shadowing echogenic foci. No definite associated vascularity by sonography.   Other findings   No abnormal free fluid.   IMPRESSION: 1. 4.3 cm complex cystic left adnexal mass, indeterminate, but could reflect a complex cystic ovarian neoplasm. Gynecologic referral for further workup and consultation recommended. Additionally, further assessment with dedicated pelvic MRI, with and without contrast, may be helpful for further evaluation as warranted. 2. Nonvisualization of the right ovary. No right adnexal mass  or free fluid within the pelvis. 3. Normal sonographic appearance of the uterus and endometrium.   These results will be called to the ordering clinician or representative by the Radiologist Assistant, and communication documented in the PACS or [employer].     Electronically Signed   By: Rise Mu M.D.   On: 06/07/2020 20:15  MR PELVIS W WO CONTRAST CLINICAL DATA:  Left-sided pelvic pain. Left adnexal mass on recent ultrasound.  EXAM: MRI PELVIS WITHOUT AND WITH CONTRAST  TECHNIQUE: Multiplanar multisequence MR imaging of the pelvis was performed both before and after administration of intravenous contrast.  CONTRAST:  79mL MULTIHANCE GADOBENATE DIMEGLUMINE 529 MG/ML IV SOLN  COMPARISON:  Ultrasound on 06/07/2020  FINDINGS: Lower Urinary Tract: No urinary bladder or urethral abnormality identified.  Bowel: Severe sigmoid diverticulosis is demonstrated.  Vascular/Lymphatic: Unremarkable. No pathologically enlarged pelvic lymph nodes identified.  Reproductive:  -- Uterus: Measures 7.6 x 3.3 x 5.1 cm (volume = 67 cm^3). No fibroids or other masses identified. Cervix and vagina are unremarkable.  -- Right ovary: Not well visualized, however no adnexal mass identified.  -- Left ovary: No normal left ovary visualized. A complex cystic and solid lesion is seen in the left adnexa, which contains irregular enhancing soft tissue density peripherally. This measures 4.2 x 4.1 by 4.5 cm, and is located immediately adjacent to the  sigmoid colon which is involved by diverticulosis. Differential diagnosis includes cystic ovarian neoplasm and diverticular abscess.  Other: No peritoneal thickening or abnormal free fluid.  Musculoskeletal:  Unremarkable.  IMPRESSION: 4.2 cm complex cystic and solid lesion in the left adnexa, which is located immediately adjacent to the sigmoid colon which shows severe diverticulosis. Differential diagnosis includes cystic  ovarian neoplasm and diverticular abscess. Recommend correlation with laboratory and clinical evaluation, and consider continued imaging follow-up after antibiotic treatment in 6 weeks with CT or MRI.  Electronically Signed   By: Danae OrleansJohn A Stahl M.D.   On: 06/18/2020 19:59  Assessment:   1. Pelvic mass in female   2. Pelvic pain in female   3. Pap smear for cervical cancer screening   4. Encounter to establish care   5. Preoperative exam for gynecologic surgery     Plan:   Pelvic mass -indeterminant pelvic mass, with differential including adnexal complex cyst versus diverticular abscess.  Patient with no other significant symptoms outside of pelvic pain.  I am inclined to lean more on the side of adnexal origin as patient is without any other signs of infection including nausea vomiting, no fever or chills, or elevation in white blood cell count noted on labs.  In the presence of an adnexal mass in a postmenopausal woman, it is recommended to further evaluate possibility of malignancy.  Will order Roma score.  Advised that if Roma score is positive, patient would need follow-up with a GYN oncologist.  She has not initiated the antibiotic course that was recommended for possible diverticular abscess as she has a history of issues with antibiotics and did not want to take unless absolutely necessary.  Advised that she could continue to hold on antibiotic at this time as she is showing no other signs of illness. Patient currently noting moderate pelvic pain, no longer relieved by ibuprofen.  Is currently taking up to 8-10 ibuprofen per day.  Desires surgical intervention if possible as she has been dealing with this pain for several months.  See below for further discussion of surgery. Smear performed today for cervical cancer screening as she is overdue for follow-up due to prior history of abnormal Pap smear. Patient desires surgical management with surgical removal of pelvic mass.  The risks of  surgery were discussed in detail with the patient including but not limited to: bleeding which may require transfusion or reoperation; infection which may require prolonged hospitalization or re-hospitalization and antibiotic therapy; injury to bowel, bladder, ureters and major vessels or other surrounding organs; formation of adhesions; need for additional procedures including laparotomy or subsequent procedures secondary to abnormal pathology; thromboembolic phenomenon; incisional problems and other postoperative or anesthesia complications.  I discussed with patient that although it was not definitive, based on imaging I am fairly confident that the mass is adnexal.  I would recommend a unilateral salpingo-oophorectomy at this time.  If Roma score does appear to be positive for concerns for malignancy, would then refer to GYN oncology who would most likely recommend bilateral removal and cancer staging.  If the mass happens to not be adnexal and indeed is a diverticular abscess, I have discussed with patient that we could perform an intraoperative consultation with general surgery as far as further management.  We would leave the ovaries intact if no other disease process noted.  Discussed that at this time there was no need for a bilateral oophorectomy, as she has no other risk factors for removal such as personal or family history  of reproductive cancer, and informed of benefits of leaving 1 remaining ovary (reducing risk of cardiovascular disease and early onset osteoporosis prior to the age of 54).  I would recommen removal of the fallopian tubes to reduce the risk of malignancy.The postoperative expectations were also discussed in detail. The patient also understands the alternative treatment options which were discussed in full (continued surveillance of the adnexal mass through ultrasound and utilization of prescribed antibiotics). All questions were answered.  She was told that she will be contacted by  our surgical scheduler regarding the time and date of her surgery; routine preoperative instructions will be given to her by the preoperative nursing team.   She is aware of need for preoperative COVID testing and subsequent quarantine from time of test to time of surgery; she will be given further preoperative instructions at that COVID screening visit.  Printed patient education handouts about the procedure were given to the patient to review at home. Patient tentatively scheduled for surgery on 07/12/2020.  Elevated BP reading, patient denies any previous history of HTN. Possibly secondary to pain. Will continue to monitor.     A total of 45 minutes were spent face-to-face with the patient during the encounter with greater than 50% dealing with  review of medical records, labs, imaging, counseling and coordination of care.    Hildred Laser, MD [facility]

## 2020-06-25 NOTE — Patient Instructions (Signed)
Diagnostic Laparoscopy Diagnostic laparoscopy is a procedure to diagnose problems in the abdomen. It might be done for a variety of reasons, such as to look for scar tissue, a reason for abdominal pain, an abdominal mass or tumor, or fluid in the abdomen (ascites). This procedure may also be done to remove a tissue sample from the liver to look at under a microscope (biopsy). During the procedure, a thin, flexible tube that has a light and a camera on the end (laparoscope) is inserted through a small incision in the abdomen. The image from the camera is shown on a monitor to help the surgeon see inside the body. Tell a health care provider about: Any allergies you have. All medicines you are taking, including vitamins, herbs, eye drops, creams, and over-the-counter medicines. Any problems you or family members have had with anesthetic medicines. Any blood disorders you have. Any surgeries you have had. Any medical conditions you have. Whether you are pregnant or may be pregnant. What are the risks? Generally, this is a safe procedure. However, problems may occur, including: Infection. Bleeding. Allergic reactions to medicines or dyes. Damage to abdominal structures or organs, such as the intestines, liver, stomach, or spleen. What happens before the procedure? Staying hydrated Follow instructions from your health care provider about hydration, which may include: Up to 2 hours before the procedure - you may continue to drink clear liquids, such as water, clear fruit juice, black coffee, and plain tea.  Eating and drinking restrictions Follow instructions from your health care provider about eating and drinking, which may include: 8 hours before the procedure - stop eating heavy meals or foods, such as meat, fried foods, or fatty foods. 6 hours before the procedure - stop eating light meals or foods, such as toast or cereal. 6 hours before the procedure - stop drinking milk or drinks that  contain milk. 2 hours before the procedure - stop drinking clear liquids. Medicines Ask your health care provider about: Changing or stopping your regular medicines. This is especially important if you are taking diabetes medicines or blood thinners. Taking medicines such as aspirin and ibuprofen. These medicines can thin your blood. Do not take these medicines unless your health care provider tells you to take them. Taking over-the-counter medicines, vitamins, herbs, and supplements. General instructions Ask your health care provider: How your surgery site will be marked. What steps will be taken to help prevent infection. These steps may include: Removing hair at the surgery site. Washing skin with a germ-killing soap. Taking antibiotic medicine. Plan to have a responsible adult take you home from the hospital or clinic. Plan to have a responsible adult care for you for the time you are told after you leave the hospital or clinic. This is important. What happens during the procedure?  An IV will be inserted into one of your veins. You will be given one or more of the following: A medicine to help you relax (sedative). A medicine to numb the area (local anesthetic). A medicine to make you fall asleep (general anesthetic). A breathing tube will be placed down your throat to help you breathe during the procedure. Your abdomen will be filled with an air-like gas so that your abdomen expands. This will give the surgeon more room to operate and will make your organs easier to see. Many small incisions will be made in your abdomen. A laparoscope and other surgical instruments will be inserted into your abdomen through these incisions. A biopsy may be done.   This will depend on the reason why you are having this procedure. The laparoscope and other instruments will be removed from your abdomen. The air-like gas will be released from your abdomen. Your incisions will be closed with stitches  (sutures), skin glue, or surgical tapes and covered with a bandage (dressing). Your breathing tube will be removed. The procedure may vary among health care providers and hospitals. What happens after the procedure? Your blood pressure, heart rate, breathing rate, and blood oxygen level will be monitored until you leave the hospital or clinic. If you were given a sedative during the procedure, it can affect you for several hours. Do not drive or operate machinery until your health care provider says that it is safe. It is up to you to get the results of your procedure. Ask your health care provider, or the department that is doing the procedure, when your results will be ready. Summary Diagnostic laparoscopy is a procedure to diagnose problems in the abdomen using a thin, flexible tube that has a light and a camera on the end (laparoscope). Follow instructions from your health care provider about how to prepare for the procedure. Plan to have a responsible adult care for you for the time you are told after you leave the hospital or clinic. This is important. This information is not intended to replace advice given to you by your health care provider. Make sure you discuss any questions you have with your health care provider. Document Revised: 08/29/2019 Document Reviewed: 08/29/2019 [employer] Patient Education  2022 [employer]  

## 2020-06-26 LAB — COMPREHENSIVE METABOLIC PANEL
ALT: 28 IU/L (ref 0–32)
AST: 16 IU/L (ref 0–40)
Albumin/Globulin Ratio: 1.7 (ref 1.2–2.2)
Albumin: 5 g/dL — ABNORMAL HIGH (ref 3.8–4.9)
Alkaline Phosphatase: 136 IU/L — ABNORMAL HIGH (ref 44–121)
BUN/Creatinine Ratio: 27 — ABNORMAL HIGH (ref 9–23)
BUN: 20 mg/dL (ref 6–24)
Bilirubin Total: 0.2 mg/dL (ref 0.0–1.2)
CO2: 25 mmol/L (ref 20–29)
Calcium: 10 mg/dL (ref 8.7–10.2)
Chloride: 102 mmol/L (ref 96–106)
Creatinine, Ser: 0.75 mg/dL (ref 0.57–1.00)
Globulin, Total: 2.9 g/dL (ref 1.5–4.5)
Glucose: 89 mg/dL (ref 65–99)
Potassium: 4.5 mmol/L (ref 3.5–5.2)
Sodium: 141 mmol/L (ref 134–144)
Total Protein: 7.9 g/dL (ref 6.0–8.5)
eGFR: 93 mL/min/{1.73_m2} (ref >59)

## 2020-06-26 LAB — OVARIAN MALIGNANCY RISK-ROMA
Cancer Antigen (CA) 125: 21.5 U/mL (ref 0.0–38.1)
HE4: 72.3 pmol/L (ref 0.0–105.2)
Postmenopausal ROMA: 1.99
Premenopausal ROMA: 1.65 — ABNORMAL HIGH

## 2020-06-26 LAB — CBC
Hematocrit: 43 % (ref 34.0–46.6)
Hemoglobin: 14.1 g/dL (ref 11.1–15.9)
MCH: 29 pg (ref 26.6–33.0)
MCHC: 32.8 g/dL (ref 31.5–35.7)
MCV: 88 fL (ref 79–97)
Platelets: 307 10*3/uL (ref 150–450)
RBC: 4.87 x10E6/uL (ref 3.77–5.28)
RDW: 13.1 % (ref 11.7–15.4)
WBC: 7.2 10*3/uL (ref 3.4–10.8)

## 2020-06-26 LAB — POSTMENOPAUSAL INTERP: LOW

## 2020-06-26 LAB — PREMENOPAUSAL INTERP: HIGH

## 2020-06-26 NOTE — H&P (Signed)
GYNECOLOGY PREOPERATIVE HISTORY AND PHYSICAL  Subjective:  Annette Wallace is a 55 y.o. [id number] postmenopausal female here for surgical management of left pelvic mass.   Patient reports that she has been experiencing pelvic pain for several months.  She reports that it was initially intermittent, sharp, stabbing, however as continue to become worse in intensity.  She has also been experiencing left groin pain as well.  Was evaluated by her PCP and had an ultrasound performed which noted a complex left adnexal mass and recommended MRI evaluation.  Patient reports that she had the MRI which still noted the mass present however differential diagnosis also included diverticular abscess.  No significant preoperative concerns.  Proposed surgery: Exam under anesthesia, Laparoscopic left oophorectomy with bilateral salpingectomy   Pertinent Gynecological History: Last pap smear: 07/17/2017, ASCUS HR HPV positive. S/p neg colposcopy Last mammogram: 07/17/2018.  Results were: normal. Last colonoscopy: 12/31/2016. Results were: normal.    Past Medical History:  Diagnosis Date   Anxiety    ASCUS with positive high risk HPV cervical 07/2017   Colposcopy negative.  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   Reflux    GERD   Squamous cell carcinoma 06/2007   OF LEFT EYE    Past Surgical History:  Procedure Laterality Date   CERVICAL SPINE SURGERY  2-13   RUPTURED DISCS   TUBAL LIGATION     WIDE EXC OF SQ CELL A OF EYE     LEFT EYE--ALSO, CRYOTHERAPY TO THE MARGINS    OB History  Gravida Para Term Preterm AB Living  3 2 2   1 2   SAB IAB Ectopic Multiple Live Births    1 0   2    # Outcome Date GA Lbr Len/2nd Weight Sex Delivery Anes PTL Lv  3 Term 1991   6 lb 3 oz (2.807 kg) F CS-Unspec Spinal, EPI  LIV  2 Term 1989   7 lb 6 oz (3.345 kg) M Vag-Spont Spinal, EPI  LIV  1 IAB              Family History  Problem Relation Age of Onset   Other Mother        SUICIDE   Heart disease Father    Diabetes Maternal Grandfather    Hypertension Maternal Grandfather    Diabetes Paternal Grandfather    Hypertension Paternal Grandfather     Social History   Socioeconomic History   Marital status: Married    Spouse name: Not on file   Number of children: Not on file   Years of education: Not on file   Highest education level: Not on file  Occupational History   Not on file  Tobacco Use   Smoking status: Former    Packs/day: 1.50    Pack years: 0.00    Types: Cigarettes    Quit date: 01/16/2009    Years since quitting: 11.4   Smokeless tobacco: Never  Vaping Use   Vaping Use: Never used  Substance and Sexual Activity   Alcohol use: No   Drug use: Yes    Types: Amyl nitrate   Sexual activity: Yes    Birth control/protection: Surgical, None    Comment: BTL,declined insurance questions,des neg  Other Topics Concern   Not on file  Social History Narrative   Not on file   Social  Determinants of Health   Financial Resource Strain: Not on file  Food Insecurity: Not on file  Transportation Needs: Not on file  Physical Activity: Not on file  Stress: Not on file  Social Connections: Not on file  Intimate Partner Violence: Not on file    Current Outpatient Medications on File Prior to Visit  Medication Sig Dispense Refill   Cyanocobalamin (VITAMIN B-12 PO) Take by mouth. 1/2 tab po qd     VITAMIN D PO Take by mouth.     No current facility-administered medications on file prior to visit.    Allergies  Allergen Reactions   Codeine     REACTION: Itching "crazy"   Penicillins     REACTION: Rash      Review of Systems Constitutional: No recent fever/chills/sweats Respiratory: No recent cough/bronchitis Cardiovascular: No chest pain Gastrointestinal: No recent nausea/vomiting/diarrhea Genitourinary: No UTI symptoms. Positive for pelvic pain.   Hematologic/lymphatic:No history of coagulopathy or recent blood thinner use    Objective:   Blood pressure (!) 160/98, pulse 80, height 5' 5"  (1.651 m), weight 150 lb 11.2 oz (68.4 kg), last menstrual period 05/30/2017. CONSTITUTIONAL: Well-developed, well-nourished female in no acute distress.  HENT:  Normocephalic, atraumatic, External right and left ear normal. Oropharynx is clear and moist EYES: Conjunctivae and EOM are normal. Pupils are equal, round, and reactive to light. No scleral icterus.  NECK: Normal range of motion, supple, no masses SKIN: Skin is warm and dry. No rash noted. Not diaphoretic. No erythema. No pallor. NEUROLOGIC: Alert and oriented to person, place, and time. Normal reflexes, muscle tone coordination. No cranial nerve deficit noted. PSYCHIATRIC: Normal mood and affect. Normal behavior. Normal judgment and thought content. CARDIOVASCULAR: Normal heart rate noted, regular rhythm RESPIRATORY: Effort and breath sounds normal, no problems with respiration noted ABDOMEN: Soft, nontender, nondistended. PELVIC: Deferred MUSCULOSKELETAL: Normal range of motion. No edema and no tenderness. 2+ distal pulses.    Labs: Results for orders placed or performed in visit on 06/25/20 (from the past 336 hour(s))  Ovarian Malignancy Risk-ROMA   Collection Time: 06/25/20 11:09 AM  Result Value Ref Range   Cancer Antigen (CA) 125 21.5 0.0 - 38.1 U/mL   HE4 72.3 0.0 - 105.2 pmol/L   Premenopausal ROMA 1.65 (H) See below   Postmenopausal ROMA 1.99 See below   Comment Comment   CBC   Collection Time: 06/25/20 11:09 AM  Result Value Ref Range   WBC 7.2 3.4 - 10.8 x10E3/uL   RBC 4.87 3.77 - 5.28 x10E6/uL   Hemoglobin 14.1 11.1 - 15.9 g/dL   Hematocrit 43.0 34.0 - 46.6 %   MCV 88 79 - 97 fL   MCH 29.0 26.6 - 33.0 pg   MCHC 32.8 31.5 - 35.7 g/dL   RDW 13.1 11.7 - 15.4 %   Platelets 307 150 - 450 x10E3/uL  Comp Met (CMET)   Collection Time: 06/25/20 11:09 AM  Result Value  Ref Range   Glucose 89 65 - 99 mg/dL   BUN 20 6 - 24 mg/dL   Creatinine, Ser 0.75 0.57 - 1.00 mg/dL   eGFR 93 >59 mL/min/1.73   BUN/Creatinine Ratio 27 (H) 9 - 23   Sodium 141 134 - 144 mmol/L   Potassium 4.5 3.5 - 5.2 mmol/L   Chloride 102 96 - 106 mmol/L   CO2 25 20 - 29 mmol/L   Calcium 10.0 8.7 - 10.2 mg/dL   Total Protein 7.9 6.0 - 8.5 g/dL   Albumin 5.0 (H)  3.8 - 4.9 g/dL   Globulin, Total 2.9 1.5 - 4.5 g/dL   Albumin/Globulin Ratio 1.7 1.2 - 2.2   Bilirubin Total 0.2 0.0 - 1.2 mg/dL   Alkaline Phosphatase 136 (H) 44 - 121 IU/L   AST 16 0 - 40 IU/L   ALT 28 0 - 32 IU/L  Premenopausal Interp: HIGH   Collection Time: 06/25/20 11:09 AM  Result Value Ref Range   Premenopausal Interp: HIGH Comment   Postmenopausal Interp: LOW   Collection Time: 06/25/20 11:09 AM  Result Value Ref Range   Postmenopausal Interp: LOW Comment      Imaging Studies: MR PELVIS W WO CONTRAST  Result Date: 06/18/2020 CLINICAL DATA:  Left-sided pelvic pain. Left adnexal mass on recent ultrasound. EXAM: MRI PELVIS WITHOUT AND WITH CONTRAST TECHNIQUE: Multiplanar multisequence MR imaging of the pelvis was performed both before and after administration of intravenous contrast. CONTRAST:  76m MULTIHANCE GADOBENATE DIMEGLUMINE 529 MG/ML IV SOLN COMPARISON:  Ultrasound on 06/07/2020 FINDINGS: Lower Urinary Tract: No urinary bladder or urethral abnormality identified. Bowel: Severe sigmoid diverticulosis is demonstrated. Vascular/Lymphatic: Unremarkable. No pathologically enlarged pelvic lymph nodes identified. Reproductive: -- Uterus: Measures 7.6 x 3.3 x 5.1 cm (volume = 67 cm^3). No fibroids or other masses identified. Cervix and vagina are unremarkable. -- Right ovary: Not well visualized, however no adnexal mass identified. -- Left ovary: No normal left ovary visualized. A complex cystic and solid lesion is seen in the left adnexa, which contains irregular enhancing soft tissue density peripherally. This  measures 4.2 x 4.1 by 4.5 cm, and is located immediately adjacent to the sigmoid colon which is involved by diverticulosis. Differential diagnosis includes cystic ovarian neoplasm and diverticular abscess. Other: No peritoneal thickening or abnormal free fluid. Musculoskeletal:  Unremarkable. IMPRESSION: 4.2 cm complex cystic and solid lesion in the left adnexa, which is located immediately adjacent to the sigmoid colon which shows severe diverticulosis. Differential diagnosis includes cystic ovarian neoplasm and diverticular abscess. Recommend correlation with laboratory and clinical evaluation, and consider continued imaging follow-up after antibiotic treatment in 6 weeks with CT or MRI. Electronically Signed   By: JMarlaine HindM.D.   On: 06/18/2020 19:59   [place] COMPLETE WITH TRANSVAGINAL  Result Date: 06/07/2020 CLINICAL DATA:  Initial evaluation for left lower quadrant pelvic pain for 1 month. EXAM: TRANSABDOMINAL AND TRANSVAGINAL ULTRASOUND OF PELVIS TECHNIQUE: Both transabdominal and transvaginal ultrasound examinations of the pelvis were performed. Transabdominal technique was performed for global imaging of the pelvis including uterus, ovaries, adnexal regions, and pelvic cul-de-sac. It was necessary to proceed with endovaginal exam following the transabdominal exam to visualize the uterus, endometrium, and ovaries. COMPARISON:  None available. FINDINGS: Uterus Measurements: 7.2 x 3.6 x 4.5 cm = volume: 60.4 mL. Uterus is anteverted. Heterogeneous echotexture seen within the uterine myometrium without discrete fibroid or other mass. Endometrium Thickness: 4.8 mm. Two adjacent subcentimeter echogenic foci in noted within the endometrial complex, likely small microcalcifications, of doubtful significance. No other focal abnormality. Right ovary Not visualized.  No adnexal mass. Left ovary Measurements: 5.1 x 4.0 x 4.8 cm = volume: 52 mL. Complex cystic lesion measuring approximately 4.3 x 2.7 x 3.7 cm  seen arising from the left ovary. Lesion is complex in appearance with possible internal solid components and shadowing echogenic foci. No definite associated vascularity by sonography. Other findings No abnormal free fluid. IMPRESSION: 1. 4.3 cm complex cystic left adnexal mass, indeterminate, but could reflect a complex cystic ovarian neoplasm. Gynecologic referral for further workup and  consultation recommended. Additionally, further assessment with dedicated pelvic MRI, with and without contrast, may be helpful for further evaluation as warranted. 2. Nonvisualization of the right ovary. No right adnexal mass or free fluid within the pelvis. 3. Normal sonographic appearance of the uterus and endometrium. These results will be called to the ordering clinician or representative by the Radiologist Assistant, and communication documented in the PACS or [employer]. Electronically Signed   By: Jeannine Boga M.D.   On: 06/07/2020 20:15    Assessment:    1. Pelvic mass in female   2. Pelvic pain in female   3. Pap smear for cervical cancer screening   4. Encounter to establish care   5. Preoperative exam for gynecologic surgery   6. Elevated blood pressure reading      Plan:   1. Pelvic mass -indeterminant pelvic mass, with differential including adnexal complex cyst versus diverticular abscess.  Patient with no other significant symptoms outside of pelvic pain.  I am inclined to lean more on the side of adnexal origin as patient is without any other signs of infection including nausea vomiting, no fever or chills, or elevation in white blood cell count noted on labs.  In the presence of an adnexal mass in a postmenopausal woman, it is recommended to further evaluate possibility of malignancy.  Will order Roma score.  Advised that if Roma score is positive, patient would need follow-up with a GYN oncologist.  She has not initiated the antibiotic course that was recommended for possible  diverticular abscess as she has a history of issues with antibiotics and did not want to take unless absolutely necessary.  Advised that she could continue to hold on antibiotic at this time as she is showing no other signs of illness. 2. Patient currently noting moderate pelvic pain, no longer relieved by ibuprofen.  Is currently taking up to 8-10 ibuprofen per day.  Desires surgical intervention if possible as she has been dealing with this pain for several months.  See below for further discussion of surgery. 3. Smear performed today for cervical cancer screening as she is overdue for follow-up due to prior history of abnormal Pap smear. 4. Patient desires surgical management with surgical removal of pelvic mass.  The risks of surgery were discussed in detail with the patient including but not limited to: bleeding which may require transfusion or reoperation; infection which may require prolonged hospitalization or re-hospitalization and antibiotic therapy; injury to bowel, bladder, ureters and major vessels or other surrounding organs; formation of adhesions; need for additional procedures including laparotomy or subsequent procedures secondary to abnormal pathology; thromboembolic phenomenon; incisional problems and other postoperative or anesthesia complications.  I discussed with patient that although it was not definitive, based on imaging I am fairly confident that the mass is adnexal.  I would recommend a unilateral salpingo-oophorectomy at this time.  If Roma score does appear to be positive for concerns for malignancy, would then refer to GYN oncology who would most likely recommend bilateral removal and cancer staging.  If the mass happens to not be adnexal and indeed is a diverticular abscess, I have discussed with patient that we could perform an intraoperative consultation with general surgery as far as further management.  We would leave the ovaries intact if no other disease process noted.   Discussed that at this time there was no need for a bilateral oophorectomy, as she has no other risk factors for removal such as personal or family history of reproductive  cancer, and informed of benefits of leaving 1 remaining ovary (reducing risk of cardiovascular disease and early onset osteoporosis prior to the age of 7).  I would recommen removal of the fallopian tubes to reduce the risk of malignancy.The postoperative expectations were also discussed in detail. The patient also understands the alternative treatment options which were discussed in full (continued surveillance of the adnexal mass through ultrasound and utilization of prescribed antibiotics). All questions were answered.  She was told that she will be contacted by our surgical scheduler regarding the time and date of her surgery; routine preoperative instructions will be given to her by the preoperative nursing team.   She is aware of need for preoperative COVID testing and subsequent quarantine from time of test to time of surgery; she will be given further preoperative instructions at that [place] screening visit.  Printed patient education handouts about the procedure were given to the patient to review at home. Patient tentatively scheduled for surgery on 07/12/2020.      Rubie Maid, MD [facility]

## 2020-06-26 NOTE — H&P (View-Only) (Signed)
GYNECOLOGY PREOPERATIVE HISTORY AND PHYSICAL  Subjective:  Annette Wallace is a 55 y.o. [id number] postmenopausal female here for surgical management of left pelvic mass.   Patient reports that she has been experiencing pelvic pain for several months.  She reports that it was initially intermittent, sharp, stabbing, however as continue to become worse in intensity.  She has also been experiencing left groin pain as well.  Was evaluated by her PCP and had an ultrasound performed which noted a complex left adnexal mass and recommended MRI evaluation.  Patient reports that she had the MRI which still noted the mass present however differential diagnosis also included diverticular abscess.  No significant preoperative concerns.  Proposed surgery: Exam under anesthesia, Laparoscopic left oophorectomy with bilateral salpingectomy   Pertinent Gynecological History: Last pap smear: 07/17/2017, ASCUS HR HPV positive. S/p neg colposcopy Last mammogram: 07/17/2018.  Results were: normal. Last colonoscopy: 12/31/2016. Results were: normal.    Past Medical History:  Diagnosis Date   Anxiety    ASCUS with positive high risk HPV cervical 07/2017   Colposcopy negative.  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   Reflux    GERD   Squamous cell carcinoma 06/2007   OF LEFT EYE    Past Surgical History:  Procedure Laterality Date   CERVICAL SPINE SURGERY  2-13   RUPTURED DISCS   TUBAL LIGATION     WIDE EXC OF SQ CELL A OF EYE     LEFT EYE--ALSO, CRYOTHERAPY TO THE MARGINS    OB History  Gravida Para Term Preterm AB Living  3 2 2   1 2   SAB IAB Ectopic Multiple Live Births    1 0   2    # Outcome Date GA Lbr Len/2nd Weight Sex Delivery Anes PTL Lv  3 Term 1991   6 lb 3 oz (2.807 kg) F CS-Unspec Spinal, EPI  LIV  2 Term 1989   7 lb 6 oz (3.345 kg) M Vag-Spont Spinal, EPI  LIV  1 IAB              Family History  Problem Relation Age of Onset   Other Mother        SUICIDE   Heart disease Father    Diabetes Maternal Grandfather    Hypertension Maternal Grandfather    Diabetes Paternal Grandfather    Hypertension Paternal Grandfather     Social History   Socioeconomic History   Marital status: Married    Spouse name: Not on file   Number of children: Not on file   Years of education: Not on file   Highest education level: Not on file  Occupational History   Not on file  Tobacco Use   Smoking status: Former    Packs/day: 1.50    Pack years: 0.00    Types: Cigarettes    Quit date: 01/16/2009    Years since quitting: 11.4   Smokeless tobacco: Never  Vaping Use   Vaping Use: Never used  Substance and Sexual Activity   Alcohol use: No   Drug use: Yes    Types: Amyl nitrate   Sexual activity: Yes    Birth control/protection: Surgical, None    Comment: BTL,declined insurance questions,des neg  Other Topics Concern   Not on file  Social History Narrative   Not on file   Social  Determinants of Health   Financial Resource Strain: Not on file  Food Insecurity: Not on file  Transportation Needs: Not on file  Physical Activity: Not on file  Stress: Not on file  Social Connections: Not on file  Intimate Partner Violence: Not on file    Current Outpatient Medications on File Prior to Visit  Medication Sig Dispense Refill   Cyanocobalamin (VITAMIN B-12 PO) Take by mouth. 1/2 tab po qd     VITAMIN D PO Take by mouth.     No current facility-administered medications on file prior to visit.    Allergies  Allergen Reactions   Codeine     REACTION: Itching "crazy"   Penicillins     REACTION: Rash      Review of Systems Constitutional: No recent fever/chills/sweats Respiratory: No recent cough/bronchitis Cardiovascular: No chest pain Gastrointestinal: No recent nausea/vomiting/diarrhea Genitourinary: No UTI symptoms. Positive for pelvic pain.   Hematologic/lymphatic:No history of coagulopathy or recent blood thinner use    Objective:   Blood pressure (!) 160/98, pulse 80, height 5' 5"  (1.651 m), weight 150 lb 11.2 oz (68.4 kg), last menstrual period 05/30/2017. CONSTITUTIONAL: Well-developed, well-nourished female in no acute distress.  HENT:  Normocephalic, atraumatic, External right and left ear normal. Oropharynx is clear and moist EYES: Conjunctivae and EOM are normal. Pupils are equal, round, and reactive to light. No scleral icterus.  NECK: Normal range of motion, supple, no masses SKIN: Skin is warm and dry. No rash noted. Not diaphoretic. No erythema. No pallor. NEUROLOGIC: Alert and oriented to person, place, and time. Normal reflexes, muscle tone coordination. No cranial nerve deficit noted. PSYCHIATRIC: Normal mood and affect. Normal behavior. Normal judgment and thought content. CARDIOVASCULAR: Normal heart rate noted, regular rhythm RESPIRATORY: Effort and breath sounds normal, no problems with respiration noted ABDOMEN: Soft, nontender, nondistended. PELVIC: Deferred MUSCULOSKELETAL: Normal range of motion. No edema and no tenderness. 2+ distal pulses.    Labs: Results for orders placed or performed in visit on 06/25/20 (from the past 336 hour(s))  Ovarian Malignancy Risk-ROMA   Collection Time: 06/25/20 11:09 AM  Result Value Ref Range   Cancer Antigen (CA) 125 21.5 0.0 - 38.1 U/mL   HE4 72.3 0.0 - 105.2 pmol/L   Premenopausal ROMA 1.65 (H) See below   Postmenopausal ROMA 1.99 See below   Comment Comment   CBC   Collection Time: 06/25/20 11:09 AM  Result Value Ref Range   WBC 7.2 3.4 - 10.8 x10E3/uL   RBC 4.87 3.77 - 5.28 x10E6/uL   Hemoglobin 14.1 11.1 - 15.9 g/dL   Hematocrit 43.0 34.0 - 46.6 %   MCV 88 79 - 97 fL   MCH 29.0 26.6 - 33.0 pg   MCHC 32.8 31.5 - 35.7 g/dL   RDW 13.1 11.7 - 15.4 %   Platelets 307 150 - 450 x10E3/uL  Comp Met (CMET)   Collection Time: 06/25/20 11:09 AM  Result Value  Ref Range   Glucose 89 65 - 99 mg/dL   BUN 20 6 - 24 mg/dL   Creatinine, Ser 0.75 0.57 - 1.00 mg/dL   eGFR 93 >59 mL/min/1.73   BUN/Creatinine Ratio 27 (H) 9 - 23   Sodium 141 134 - 144 mmol/L   Potassium 4.5 3.5 - 5.2 mmol/L   Chloride 102 96 - 106 mmol/L   CO2 25 20 - 29 mmol/L   Calcium 10.0 8.7 - 10.2 mg/dL   Total Protein 7.9 6.0 - 8.5 g/dL   Albumin 5.0 (H)  3.8 - 4.9 g/dL   Globulin, Total 2.9 1.5 - 4.5 g/dL   Albumin/Globulin Ratio 1.7 1.2 - 2.2   Bilirubin Total 0.2 0.0 - 1.2 mg/dL   Alkaline Phosphatase 136 (H) 44 - 121 IU/L   AST 16 0 - 40 IU/L   ALT 28 0 - 32 IU/L  Premenopausal Interp: HIGH   Collection Time: 06/25/20 11:09 AM  Result Value Ref Range   Premenopausal Interp: HIGH Comment   Postmenopausal Interp: LOW   Collection Time: 06/25/20 11:09 AM  Result Value Ref Range   Postmenopausal Interp: LOW Comment      Imaging Studies: MR PELVIS W WO CONTRAST  Result Date: 06/18/2020 CLINICAL DATA:  Left-sided pelvic pain. Left adnexal mass on recent ultrasound. EXAM: MRI PELVIS WITHOUT AND WITH CONTRAST TECHNIQUE: Multiplanar multisequence MR imaging of the pelvis was performed both before and after administration of intravenous contrast. CONTRAST:  19m MULTIHANCE GADOBENATE DIMEGLUMINE 529 MG/ML IV SOLN COMPARISON:  Ultrasound on 06/07/2020 FINDINGS: Lower Urinary Tract: No urinary bladder or urethral abnormality identified. Bowel: Severe sigmoid diverticulosis is demonstrated. Vascular/Lymphatic: Unremarkable. No pathologically enlarged pelvic lymph nodes identified. Reproductive: -- Uterus: Measures 7.6 x 3.3 x 5.1 cm (volume = 67 cm^3). No fibroids or other masses identified. Cervix and vagina are unremarkable. -- Right ovary: Not well visualized, however no adnexal mass identified. -- Left ovary: No normal left ovary visualized. A complex cystic and solid lesion is seen in the left adnexa, which contains irregular enhancing soft tissue density peripherally. This  measures 4.2 x 4.1 by 4.5 cm, and is located immediately adjacent to the sigmoid colon which is involved by diverticulosis. Differential diagnosis includes cystic ovarian neoplasm and diverticular abscess. Other: No peritoneal thickening or abnormal free fluid. Musculoskeletal:  Unremarkable. IMPRESSION: 4.2 cm complex cystic and solid lesion in the left adnexa, which is located immediately adjacent to the sigmoid colon which shows severe diverticulosis. Differential diagnosis includes cystic ovarian neoplasm and diverticular abscess. Recommend correlation with laboratory and clinical evaluation, and consider continued imaging follow-up after antibiotic treatment in 6 weeks with CT or MRI. Electronically Signed   By: JMarlaine HindM.D.   On: 06/18/2020 19:59   [place] COMPLETE WITH TRANSVAGINAL  Result Date: 06/07/2020 CLINICAL DATA:  Initial evaluation for left lower quadrant pelvic pain for 1 month. EXAM: TRANSABDOMINAL AND TRANSVAGINAL ULTRASOUND OF PELVIS TECHNIQUE: Both transabdominal and transvaginal ultrasound examinations of the pelvis were performed. Transabdominal technique was performed for global imaging of the pelvis including uterus, ovaries, adnexal regions, and pelvic cul-de-sac. It was necessary to proceed with endovaginal exam following the transabdominal exam to visualize the uterus, endometrium, and ovaries. COMPARISON:  None available. FINDINGS: Uterus Measurements: 7.2 x 3.6 x 4.5 cm = volume: 60.4 mL. Uterus is anteverted. Heterogeneous echotexture seen within the uterine myometrium without discrete fibroid or other mass. Endometrium Thickness: 4.8 mm. Two adjacent subcentimeter echogenic foci in noted within the endometrial complex, likely small microcalcifications, of doubtful significance. No other focal abnormality. Right ovary Not visualized.  No adnexal mass. Left ovary Measurements: 5.1 x 4.0 x 4.8 cm = volume: 52 mL. Complex cystic lesion measuring approximately 4.3 x 2.7 x 3.7 cm  seen arising from the left ovary. Lesion is complex in appearance with possible internal solid components and shadowing echogenic foci. No definite associated vascularity by sonography. Other findings No abnormal free fluid. IMPRESSION: 1. 4.3 cm complex cystic left adnexal mass, indeterminate, but could reflect a complex cystic ovarian neoplasm. Gynecologic referral for further workup and  consultation recommended. Additionally, further assessment with dedicated pelvic MRI, with and without contrast, may be helpful for further evaluation as warranted. 2. Nonvisualization of the right ovary. No right adnexal mass or free fluid within the pelvis. 3. Normal sonographic appearance of the uterus and endometrium. These results will be called to the ordering clinician or representative by the Radiologist Assistant, and communication documented in the PACS or [employer]. Electronically Signed   By: Jeannine Boga M.D.   On: 06/07/2020 20:15    Assessment:    1. Pelvic mass in female   2. Pelvic pain in female   3. Pap smear for cervical cancer screening   4. Encounter to establish care   5. Preoperative exam for gynecologic surgery   6. Elevated blood pressure reading      Plan:   1. Pelvic mass -indeterminant pelvic mass, with differential including adnexal complex cyst versus diverticular abscess.  Patient with no other significant symptoms outside of pelvic pain.  I am inclined to lean more on the side of adnexal origin as patient is without any other signs of infection including nausea vomiting, no fever or chills, or elevation in white blood cell count noted on labs.  In the presence of an adnexal mass in a postmenopausal woman, it is recommended to further evaluate possibility of malignancy.  Will order Roma score.  Advised that if Roma score is positive, patient would need follow-up with a GYN oncologist.  She has not initiated the antibiotic course that was recommended for possible  diverticular abscess as she has a history of issues with antibiotics and did not want to take unless absolutely necessary.  Advised that she could continue to hold on antibiotic at this time as she is showing no other signs of illness. 2. Patient currently noting moderate pelvic pain, no longer relieved by ibuprofen.  Is currently taking up to 8-10 ibuprofen per day.  Desires surgical intervention if possible as she has been dealing with this pain for several months.  See below for further discussion of surgery. 3. Smear performed today for cervical cancer screening as she is overdue for follow-up due to prior history of abnormal Pap smear. 4. Patient desires surgical management with surgical removal of pelvic mass.  The risks of surgery were discussed in detail with the patient including but not limited to: bleeding which may require transfusion or reoperation; infection which may require prolonged hospitalization or re-hospitalization and antibiotic therapy; injury to bowel, bladder, ureters and major vessels or other surrounding organs; formation of adhesions; need for additional procedures including laparotomy or subsequent procedures secondary to abnormal pathology; thromboembolic phenomenon; incisional problems and other postoperative or anesthesia complications.  I discussed with patient that although it was not definitive, based on imaging I am fairly confident that the mass is adnexal.  I would recommend a unilateral salpingo-oophorectomy at this time.  If Roma score does appear to be positive for concerns for malignancy, would then refer to GYN oncology who would most likely recommend bilateral removal and cancer staging.  If the mass happens to not be adnexal and indeed is a diverticular abscess, I have discussed with patient that we could perform an intraoperative consultation with general surgery as far as further management.  We would leave the ovaries intact if no other disease process noted.   Discussed that at this time there was no need for a bilateral oophorectomy, as she has no other risk factors for removal such as personal or family history of reproductive  cancer, and informed of benefits of leaving 1 remaining ovary (reducing risk of cardiovascular disease and early onset osteoporosis prior to the age of 48).  I would recommen removal of the fallopian tubes to reduce the risk of malignancy.The postoperative expectations were also discussed in detail. The patient also understands the alternative treatment options which were discussed in full (continued surveillance of the adnexal mass through ultrasound and utilization of prescribed antibiotics). All questions were answered.  She was told that she will be contacted by our surgical scheduler regarding the time and date of her surgery; routine preoperative instructions will be given to her by the preoperative nursing team.   She is aware of need for preoperative COVID testing and subsequent quarantine from time of test to time of surgery; she will be given further preoperative instructions at that [place] screening visit.  Printed patient education handouts about the procedure were given to the patient to review at home. Patient tentatively scheduled for surgery on 07/12/2020.      Rubie Maid, MD [facility]

## 2020-06-30 LAB — CYTOLOGY - PAP
Comment: NEGATIVE
Diagnosis: NEGATIVE
High risk HPV: NEGATIVE

## 2020-07-06 ENCOUNTER — Other Ambulatory Visit: Payer: Self-pay

## 2020-07-06 ENCOUNTER — Encounter
Admission: RE | Admit: 2020-07-06 | Discharge: 2020-07-06 | Disposition: A | Discharge status: Home / Self Care | Payer: BLUE CROSS/BLUE SHIELD | Source: Ambulatory Visit | Attending: Obstetrics and Gynecology | Admitting: Obstetrics and Gynecology

## 2020-07-06 NOTE — Patient Instructions (Addendum)
Your procedure is scheduled on: 07/12/20 Report to DAY SURGERY DEPARTMENT LOCATED ON 2ND FLOOR MEDICAL MALL ENTRANCE. To find out your arrival time please call [phone] between 1PM - 3PM on  07/09/20.  Remember: Instructions that are not followed completely may result in serious medical risk, up to and including death, or upon the discretion of your surgeon and anesthesiologist your surgery may need to be rescheduled.     _X__ 1. Do not eat food after midnight the night before your procedure.                 No gum chewing or hard candies. You may drink clear liquids up to 2 hours                 before you are scheduled to arrive for your surgery- DO not drink clear                 liquids within 2 hours of the start of your surgery.                 Clear Liquids include:  water, apple juice without pulp, clear carbohydrate                 drink such as Clearfast or Gatorade, Black Coffee or Tea (Do not add                 anything to coffee or tea). Diabetics water only  __X__2.  On the morning of surgery brush your teeth with toothpaste and water, you                 may rinse your mouth with mouthwash if you wish.  Do not swallow any              toothpaste of mouthwash.     _X__ 3.  No Alcohol for 24 hours before or after surgery.   _X__ 4.  Do Not Smoke or use e-cigarettes For 24 Hours Prior to Your Surgery.                 Do not use any chewable tobacco products for at least 6 hours prior to                 surgery.  ____  5.  Bring all medications with you on the day of surgery if instructed.   __X__  6.  Notify your doctor if there is any change in your medical condition      (cold, fever, infections).     Do not wear jewelry, make-up, hairpins, clips or nail polish. Do not wear lotions, powders, or perfumes.  Do not shave 48 hours prior to surgery. Men may shave face and neck. Do not bring valuables to the hospital.    [facility] is not responsible for any belongings  or valuables.  Contacts, dentures/partials or body piercings may not be worn into surgery. Bring a case for your contacts, glasses or hearing aids, a denture cup will be supplied. Leave your suitcase in the car. After surgery it may be brought to your room. For patients admitted to the hospital, discharge time is determined by your treatment team.   Patients discharged the day of surgery will not be allowed to drive home.   Please read over the following fact sheets that you were given:   MRSA Information, chg soap  __X__ Take these medicines the morning of surgery with A SIP OF  WATER:    1. none  2.   3.   4.  5.  6.  ____ Fleet Enema (as directed)   __X__ Use CHG Soap/SAGE wipes as directed  ____ Use inhalers on the day of surgery  ____ Stop metformin/Janumet/Farxiga 2 days prior to surgery    ____ Take 1/2 of usual insulin dose the night before surgery. No insulin the morning          of surgery.   ____ Stop Blood Thinners Coumadin/Plavix/Xarelto/Pleta/Pradaxa/Eliquis/Effient/Aspirin  on   Or contact your Surgeon, Cardiologist or Medical Doctor regarding  ability to stop your blood thinners  __X__ Stop Anti-inflammatories 7 days before surgery such as Advil, Ibuprofen, Motrin,  BC or Goodies Powder, Naprosyn, Naproxen, Aleve, Aspirin    __X__ Stop all herbal supplements, fish oil or vitamin E until after surgery.    ____ Bring C-Pap to the hospital.

## 2020-07-08 ENCOUNTER — Other Ambulatory Visit
Admission: RE | Admit: 2020-07-08 | Discharge: 2020-07-08 | Disposition: A | Discharge status: Home / Self Care | Payer: BLUE CROSS/BLUE SHIELD | Source: Ambulatory Visit | Attending: Obstetrics and Gynecology | Admitting: Obstetrics and Gynecology

## 2020-07-08 ENCOUNTER — Other Ambulatory Visit: Payer: Self-pay

## 2020-07-08 ENCOUNTER — Other Ambulatory Visit: Payer: BLUE CROSS/BLUE SHIELD

## 2020-07-08 DIAGNOSIS — Z20822 Contact with and (suspected) exposure to covid-19: Secondary | ICD-10-CM | POA: Diagnosis not present

## 2020-07-08 DIAGNOSIS — Z01812 Encounter for preprocedural laboratory examination: Secondary | ICD-10-CM | POA: Diagnosis present

## 2020-07-08 LAB — SARS CORONAVIRUS 2 (TAT 6-24 HRS): SARS Coronavirus 2: NEGATIVE

## 2020-07-09 ENCOUNTER — Other Ambulatory Visit: Admission: RE | Admit: 2020-07-09 | Payer: BLUE CROSS/BLUE SHIELD | Source: Ambulatory Visit

## 2020-07-12 ENCOUNTER — Encounter: Payer: Self-pay | Admitting: Obstetrics and Gynecology

## 2020-07-12 ENCOUNTER — Ambulatory Visit: Payer: BLUE CROSS/BLUE SHIELD | Admitting: Certified Registered Nurse Anesthetist

## 2020-07-12 ENCOUNTER — Other Ambulatory Visit: Payer: Self-pay

## 2020-07-12 ENCOUNTER — Ambulatory Visit
Admission: RE | Admit: 2020-07-12 | Discharge: 2020-07-12 | Disposition: A | Discharge status: Home / Self Care | Payer: BLUE CROSS/BLUE SHIELD | Attending: Obstetrics and Gynecology | Admitting: Obstetrics and Gynecology

## 2020-07-12 ENCOUNTER — Encounter
Admission: RE | Disposition: A | Discharge status: Home / Self Care | Payer: Self-pay | Source: Home / Self Care | Attending: Obstetrics and Gynecology

## 2020-07-12 DIAGNOSIS — R1900 Intra-abdominal and pelvic swelling, mass and lump, unspecified site: Secondary | ICD-10-CM | POA: Diagnosis present

## 2020-07-12 DIAGNOSIS — N802 Endometriosis of fallopian tube: Secondary | ICD-10-CM | POA: Insufficient documentation

## 2020-07-12 DIAGNOSIS — N736 Female pelvic peritoneal adhesions (postinfective): Secondary | ICD-10-CM | POA: Diagnosis not present

## 2020-07-12 DIAGNOSIS — N838 Other noninflammatory disorders of ovary, fallopian tube and broad ligament: Secondary | ICD-10-CM | POA: Insufficient documentation

## 2020-07-12 DIAGNOSIS — N7011 Chronic salpingitis: Secondary | ICD-10-CM | POA: Insufficient documentation

## 2020-07-12 DIAGNOSIS — Z87891 Personal history of nicotine dependence: Secondary | ICD-10-CM | POA: Insufficient documentation

## 2020-07-12 HISTORY — PX: LAPAROSCOPIC BILATERAL SALPINGECTOMY: SHX5889

## 2020-07-12 SURGERY — SALPINGECTOMY, BILATERAL, LAPAROSCOPIC
Anesthesia: General | Laterality: Left

## 2020-07-12 MED ORDER — MIDAZOLAM HCL 2 MG/2ML IJ SOLN
INTRAMUSCULAR | Status: DC | PRN
  Administered 2020-07-12: 2 mg via INTRAVENOUS

## 2020-07-12 MED ORDER — LACTATED RINGERS IV SOLN: INTRAVENOUS | Status: DC

## 2020-07-12 MED ORDER — FENTANYL CITRATE (PF) 100 MCG/2ML IJ SOLN
INTRAMUSCULAR | Status: AC
  Filled 2020-07-12: qty 2

## 2020-07-12 MED ORDER — ROCURONIUM BROMIDE 100 MG/10ML IV SOLN
INTRAVENOUS | Status: DC | PRN
  Administered 2020-07-12 (×2): 5 mg via INTRAVENOUS
  Administered 2020-07-12: 40 mg via INTRAVENOUS

## 2020-07-12 MED ORDER — SIMETHICONE 80 MG PO CHEW: 80.0000 mg | CHEWABLE_TABLET | Freq: Four times a day (QID) | ORAL | 2 refills | Status: DC | PRN

## 2020-07-12 MED ORDER — FENTANYL CITRATE (PF) 100 MCG/2ML IJ SOLN
INTRAMUSCULAR | Status: AC
  Administered 2020-07-12: 25 ug via INTRAVENOUS
  Filled 2020-07-12: qty 2

## 2020-07-12 MED ORDER — OXYCODONE HCL 5 MG PO TABS
ORAL_TABLET | ORAL | Status: AC
  Administered 2020-07-12: 5 mg via ORAL
  Filled 2020-07-12: qty 1

## 2020-07-12 MED ORDER — ACETAMINOPHEN 500 MG PO TABS: 1000.0000 mg | ORAL_TABLET | Freq: Four times a day (QID) | ORAL | 1 refills | Status: DC | PRN

## 2020-07-12 MED ORDER — PROPOFOL 10 MG/ML IV BOLUS
INTRAVENOUS | Status: DC | PRN
  Administered 2020-07-12: 150 mg via INTRAVENOUS
  Administered 2020-07-12: 50 mg via INTRAVENOUS

## 2020-07-12 MED ORDER — DEXAMETHASONE SODIUM PHOSPHATE 10 MG/ML IJ SOLN
INTRAMUSCULAR | Status: DC | PRN
  Administered 2020-07-12: 8 mg via INTRAVENOUS

## 2020-07-12 MED ORDER — PROPOFOL 10 MG/ML IV BOLUS
INTRAVENOUS | Status: AC
  Filled 2020-07-12: qty 60

## 2020-07-12 MED ORDER — PHENYLEPHRINE HCL (PRESSORS) 10 MG/ML IV SOLN
INTRAVENOUS | Status: AC
  Filled 2020-07-12: qty 1

## 2020-07-12 MED ORDER — ACETAMINOPHEN 500 MG PO TABS
1000.0000 mg | ORAL_TABLET | ORAL | Status: AC
  Administered 2020-07-12: 1000 mg via ORAL

## 2020-07-12 MED ORDER — SUGAMMADEX SODIUM 200 MG/2ML IV SOLN
INTRAVENOUS | Status: DC | PRN
  Administered 2020-07-12: 140 mg via INTRAVENOUS

## 2020-07-12 MED ORDER — CEFAZOLIN SODIUM-DEXTROSE 2-3 GM-%(50ML) IV SOLR
INTRAVENOUS | Status: DC | PRN
  Administered 2020-07-12: 2 g via INTRAVENOUS

## 2020-07-12 MED ORDER — POVIDONE-IODINE 10 % EX SWAB: 2.0000 "application " | Freq: Once | CUTANEOUS | Status: DC

## 2020-07-12 MED ORDER — FENTANYL CITRATE (PF) 100 MCG/2ML IJ SOLN
25.0000 ug | INTRAMUSCULAR | Status: DC | PRN
  Administered 2020-07-12: 25 ug via INTRAVENOUS

## 2020-07-12 MED ORDER — IBUPROFEN 600 MG PO TABS
600.0000 mg | ORAL_TABLET | Freq: Four times a day (QID) | ORAL | 1 refills | Status: DC | PRN
Start: 2020-07-12 — End: 2020-09-17

## 2020-07-12 MED ORDER — FAMOTIDINE 20 MG PO TABS
ORAL_TABLET | ORAL | Status: AC
  Administered 2020-07-12: 20 mg via ORAL
  Filled 2020-07-12: qty 1

## 2020-07-12 MED ORDER — FAMOTIDINE 20 MG PO TABS: 20.0000 mg | ORAL_TABLET | Freq: Once | ORAL | Status: AC

## 2020-07-12 MED ORDER — ONDANSETRON HCL 4 MG/2ML IJ SOLN
INTRAMUSCULAR | Status: DC | PRN
  Administered 2020-07-12: 4 mg via INTRAVENOUS

## 2020-07-12 MED ORDER — OXYCODONE HCL 5 MG PO TABS
5.0000 mg | ORAL_TABLET | Freq: Once | ORAL | Status: AC
Start: 2020-07-12 — End: 2020-07-12

## 2020-07-12 MED ORDER — DEXMEDETOMIDINE (PRECEDEX) IN NS 20 MCG/5ML (4 MCG/ML) IV SYRINGE
PREFILLED_SYRINGE | INTRAVENOUS | Status: DC | PRN
  Administered 2020-07-12: 6 ug via INTRAVENOUS

## 2020-07-12 MED ORDER — LIDOCAINE HCL (CARDIAC) PF 100 MG/5ML IV SOSY
PREFILLED_SYRINGE | INTRAVENOUS | Status: DC | PRN
  Administered 2020-07-12: 70 mg via INTRAVENOUS

## 2020-07-12 MED ORDER — FENTANYL CITRATE (PF) 100 MCG/2ML IJ SOLN
INTRAMUSCULAR | Status: DC | PRN
  Administered 2020-07-12: 50 ug via INTRAVENOUS

## 2020-07-12 MED ORDER — CHLORHEXIDINE GLUCONATE 0.12 % MT SOLN: 15.0000 mL | Freq: Once | OROMUCOSAL | Status: AC

## 2020-07-12 MED ORDER — 0.9 % SODIUM CHLORIDE (POUR BTL) OPTIME
TOPICAL | Status: DC | PRN
  Administered 2020-07-12: 100 mL

## 2020-07-12 MED ORDER — CEFAZOLIN SODIUM 1 G IJ SOLR
INTRAMUSCULAR | Status: AC
  Filled 2020-07-12: qty 20

## 2020-07-12 MED ORDER — GABAPENTIN 300 MG PO CAPS: 300.0000 mg | ORAL_CAPSULE | ORAL | Status: AC

## 2020-07-12 MED ORDER — ACETAMINOPHEN 500 MG PO TABS
ORAL_TABLET | ORAL | Status: AC
  Filled 2020-07-12: qty 2

## 2020-07-12 MED ORDER — BUPIVACAINE HCL 0.5 % IJ SOLN
INTRAMUSCULAR | Status: DC | PRN
  Administered 2020-07-12: 20 mL

## 2020-07-12 MED ORDER — ORAL CARE MOUTH RINSE: 15.0000 mL | Freq: Once | OROMUCOSAL | Status: AC

## 2020-07-12 MED ORDER — PHENYLEPHRINE HCL (PRESSORS) 10 MG/ML IV SOLN
INTRAVENOUS | Status: DC | PRN
  Administered 2020-07-12 (×2): 100 ug via INTRAVENOUS

## 2020-07-12 MED ORDER — PROMETHAZINE HCL 25 MG/ML IJ SOLN
6.2500 mg | INTRAMUSCULAR | Status: DC | PRN
Start: 2020-07-12 — End: 2020-07-12

## 2020-07-12 MED ORDER — MIDAZOLAM HCL 2 MG/2ML IJ SOLN
INTRAMUSCULAR | Status: AC
  Filled 2020-07-12: qty 2

## 2020-07-12 MED ORDER — GABAPENTIN 300 MG PO CAPS
ORAL_CAPSULE | ORAL | Status: AC
  Administered 2020-07-12: 300 mg via ORAL
  Filled 2020-07-12: qty 1

## 2020-07-12 MED ORDER — CHLORHEXIDINE GLUCONATE 0.12 % MT SOLN
OROMUCOSAL | Status: AC
  Administered 2020-07-12: 15 mL via OROMUCOSAL
  Filled 2020-07-12: qty 15

## 2020-07-12 MED ORDER — KETOROLAC TROMETHAMINE 30 MG/ML IJ SOLN
INTRAMUSCULAR | Status: DC | PRN
  Administered 2020-07-12: 30 mg via INTRAVENOUS

## 2020-07-12 MED ORDER — CEFAZOLIN SODIUM-DEXTROSE 2-4 GM/100ML-% IV SOLN
INTRAVENOUS | Status: AC
  Filled 2020-07-12: qty 100

## 2020-07-12 MED ORDER — SODIUM CHLORIDE FLUSH 0.9 % IV SOLN
INTRAVENOUS | Status: AC
  Filled 2020-07-12: qty 10

## 2020-07-12 MED ORDER — OXYCODONE HCL 5 MG PO TABS: 5.0000 mg | ORAL_TABLET | Freq: Four times a day (QID) | ORAL | 0 refills | Status: DC | PRN

## 2020-07-12 SURGICAL SUPPLY — 43 items
ADH SKN CLS APL DERMABOND .7 (GAUZE/BANDAGES/DRESSINGS) ×2
APL PRP STRL LF DISP 70% ISPRP (MISCELLANEOUS) ×2
BACTOSHIELD CHG 4% 4OZ (MISCELLANEOUS) ×1
BAG SPEC RTRVL LRG 6X4 10 (ENDOMECHANICALS)
BLADE SURG SZ11 CARB STEEL (BLADE) ×3 IMPLANT
CATH ROBINSON RED A/P 16FR (CATHETERS) ×3 IMPLANT
CHLORAPREP W/TINT 26 (MISCELLANEOUS) ×3 IMPLANT
CORD MONOPOLAR M/FML 12FT (MISCELLANEOUS) IMPLANT
COVER WAND RF STERILE (DRAPES) IMPLANT
DERMABOND ADVANCED (GAUZE/BANDAGES/DRESSINGS) ×1
DERMABOND ADVANCED .7 DNX12 (GAUZE/BANDAGES/DRESSINGS) ×2 IMPLANT
GAUZE 4X4 16PLY ~~LOC~~+RFID DBL (SPONGE) ×3 IMPLANT
GLOVE SURG ENC MOIS LTX SZ6.5 (GLOVE) ×6 IMPLANT
GLOVE SURG ENC MOIS LTX SZ8 (GLOVE) ×3 IMPLANT
GLOVE SURG UNDER LTX SZ7 (GLOVE) ×9 IMPLANT
GOWN STRL REUS W/ TWL LRG LVL3 (GOWN DISPOSABLE) IMPLANT
GOWN STRL REUS W/TWL LRG LVL3 (GOWN DISPOSABLE)
GOWN STRL REUS W/TWL XL LVL4 (GOWN DISPOSABLE) ×9 IMPLANT
GRASPER SUT TROCAR 14GX15 (MISCELLANEOUS) IMPLANT
IRRIGATION STRYKERFLOW (MISCELLANEOUS) ×2 IMPLANT
IRRIGATOR STRYKERFLOW (MISCELLANEOUS) ×3
IV LACTATED RINGERS 1000ML (IV SOLUTION) ×3 IMPLANT
KIT PINK PAD W/HEAD ARE REST (MISCELLANEOUS) ×3
KIT PINK PAD W/HEAD ARM REST (MISCELLANEOUS) ×2 IMPLANT
KIT TURNOVER CYSTO (KITS) ×3 IMPLANT
MANIFOLD NEPTUNE II (INSTRUMENTS) ×3 IMPLANT
NS IRRIG 500ML POUR BTL (IV SOLUTION) ×3 IMPLANT
PACK GYN LAPAROSCOPIC (MISCELLANEOUS) ×3 IMPLANT
PAD OB MATERNITY 4.3X12.25 (PERSONAL CARE ITEMS) ×3 IMPLANT
PAD PREP 24X41 OB/GYN DISP (PERSONAL CARE ITEMS) ×3 IMPLANT
POUCH ENDO CATCH 10MM SPEC (MISCELLANEOUS) IMPLANT
POUCH SPECIMEN RETRIEVAL 10MM (ENDOMECHANICALS) IMPLANT
SCISSORS METZENBAUM CVD 33 (INSTRUMENTS) IMPLANT
SCRUB CHG 4% DYNA-HEX 4OZ (MISCELLANEOUS) ×2 IMPLANT
SET TUBE SMOKE EVAC HIGH FLOW (TUBING) ×3 IMPLANT
SHEARS HARMONIC ACE PLUS 36CM (ENDOMECHANICALS) ×3 IMPLANT
SLEEVE ENDOPATH XCEL 5M (ENDOMECHANICALS) ×3 IMPLANT
SUT VIC AB 3-0 SH 27 (SUTURE)
SUT VIC AB 3-0 SH 27X BRD (SUTURE) IMPLANT
SUT VICRYL 0 AB UR-6 (SUTURE) ×3 IMPLANT
TROCAR ENDO BLADELESS 11MM (ENDOMECHANICALS) ×3 IMPLANT
TROCAR XCEL NON-BLD 5MMX100MML (ENDOMECHANICALS) ×3 IMPLANT
TROCAR XCEL UNIV SLVE 11M 100M (ENDOMECHANICALS) ×3 IMPLANT

## 2020-07-12 NOTE — Interval H&P Note (Signed)
History and Physical Interval Note:  07/12/2020 7:15 AM  Annette Wallace  has presented today for surgery, with the diagnosis of Left Pelvic Mass.  The various methods of treatment have been discussed with the patient and family. After consideration of risks, benefits and other options for treatment, the patient has consented to  Procedure(s): LAPAROSCOPIC BILATERAL SALPINGECTOMY (Bilateral), LAPAROSCOPIC OOPHORECTOMY (Left) as a surgical intervention.  The patient's history has been reviewed, patient examined, no change in status, stable for surgery.  I have reviewed the patient's chart and labs.  Questions were answered to the patient's satisfaction.     Hildred Laser, MD [facility]

## 2020-07-12 NOTE — Discharge Instructions (Addendum)

## 2020-07-12 NOTE — Anesthesia Preprocedure Evaluation (Signed)
Anesthesia Evaluation  Patient identified by MRN, date of birth, ID band Patient awake    Reviewed: Allergy & Precautions, H&P , NPO status , Patient's Chart, lab work & pertinent test results, reviewed documented beta blocker date and time   History of Anesthesia Complications Negative for: history of anesthetic complications  Airway Mallampati: III  TM Distance: >3 FB Neck ROM: full    Dental  (+) Dental Advidsory Given, Teeth Intact   Pulmonary neg shortness of breath, neg sleep apnea, neg COPD, neg recent URI, Current Smoker and Patient abstained from smoking.,    Pulmonary exam normal breath sounds clear to auscultation       Cardiovascular Exercise Tolerance: Good negative cardio ROS Normal cardiovascular exam Rhythm:regular Rate:Normal     Neuro/Psych PSYCHIATRIC DISORDERS Anxiety negative neurological ROS     GI/Hepatic negative GI ROS, Neg liver ROS,   Endo/Other  negative endocrine ROS  Renal/GU negative Renal ROS  negative genitourinary   Musculoskeletal   Abdominal   Peds  Hematology negative hematology ROS (+)   Anesthesia Other Findings Past Medical History: No date: Anxiety 07/2017: ASCUS with positive high risk HPV cervical     Comment:  Colposcopy negative.  ECC benign QUIT 2011: Former cigarette smoker     Comment:  1.5 PPD SMOKER O1-25-13: Goiter 12/2009: Ramsay Hunt syndrome (geniculate herpes zoster) No date: Reflux     Comment:  GERD 06/2007: Squamous cell carcinoma     Comment:  OF LEFT EYE   Reproductive/Obstetrics negative OB ROS                             Anesthesia Physical Anesthesia Plan  ASA: 2  Anesthesia Plan: General   Post-op Pain Management:    Induction: Intravenous  PONV Risk Score and Plan: 2 and Ondansetron, Dexamethasone, Midazolam and Treatment may vary due to age or medical condition  Airway Management Planned: Oral  ETT  Additional Equipment:   Intra-op Plan:   Post-operative Plan: Extubation in OR  Informed Consent: I have reviewed the patients History and Physical, chart, labs and discussed the procedure including the risks, benefits and alternatives for the proposed anesthesia with the patient or authorized representative who has indicated his/her understanding and acceptance.     Dental Advisory Given  Plan Discussed with: Anesthesiologist, CRNA and Surgeon  Anesthesia Plan Comments:         Anesthesia Quick Evaluation

## 2020-07-12 NOTE — Transfer of Care (Signed)
Immediate Anesthesia Transfer of Care Note  Patient: Annette Wallace  Procedure(s) Performed: LAPAROSCOPIC BILATERAL SALPINGECTOMY (Bilateral) LAPAROSCOPIC OOPHORECTOMY (Left)  Patient Location: PACU  Anesthesia Type:General  Level of Consciousness: awake, drowsy and patient cooperative  Airway & Oxygen Therapy: Patient Spontanous Breathing  Post-op Assessment: Report given to RN and Post -op Vital signs reviewed and stable  Post vital signs: Reviewed and stable  Last Vitals:  Vitals Value Taken Time  BP 162/76 07/12/20 0933  Temp    Pulse 70 07/12/20 0941  Resp 11 07/12/20 0941  SpO2 100 % 07/12/20 0941  Vitals shown include unvalidated device data.  Last Pain:  Vitals:   07/12/20 0627  TempSrc: Oral  PainSc: 5          Complications: No notable events documented.

## 2020-07-12 NOTE — Anesthesia Procedure Notes (Signed)
Procedure Name: Intubation Date/Time: 07/12/2020 7:40 AM Performed by: Henrietta Hoover, CRNA Pre-anesthesia Checklist: Patient identified, Emergency Drugs available, Suction available and Patient being monitored Patient Re-evaluated:Patient Re-evaluated prior to induction Oxygen Delivery Method: Circle system utilized Preoxygenation: Pre-oxygenation with 100% oxygen Induction Type: IV induction and Cricoid Pressure applied Ventilation: Mask ventilation without difficulty Laryngoscope Size: 3 and McGraph Grade View: Grade II Tube type: Oral Tube size: 6.5 mm Number of attempts: 1 Airway Equipment and Method: Stylet and Video-laryngoscopy Placement Confirmation: ETT inserted through vocal cords under direct vision, positive ETCO2 and breath sounds checked- equal and bilateral Secured at: 21 cm Tube secured with: Tape Dental Injury: Teeth and Oropharynx as per pre-operative assessment

## 2020-07-12 NOTE — Op Note (Addendum)
Procedure(s): LAPAROSCOPIC BILATERAL SALPINGECTOMY LAPAROSCOPIC OOPHORECTOMY, ADHESIOLYSIS Procedure Note  ZOI DEVINE female 55 y.o. 07/12/2020  Indications: The patient is a 55 y.o. L3Y1017 female with left pelvic mass, pelvic pain. History of tubal ligation.  Pre-operative Diagnosis: Left pelvic mass, pelvic pain  Post-operative Diagnosis: Same, with dense pelvic adhesions, adnexal fibroids  Surgeon: Hildred Laser, MD  Assistants:  Surgical scrub assist.   Anesthesia: General endotracheal anesthesia  Findings: - The uterus was sounded to 6 cm - Dense pelvic adhesions of the omentum and left adnexa (including mass) to the posterior uterine surface. Obliteration of the left ovarian fossa. Also filmy adhesions of the right adnexa to the posterior uterine surface and right pelvic side wall.  - Small fibroids noted on bilateral fallopian tubes  Procedure Details: The patient was seen in the Holding Room. The risks, benefits, complications, treatment options, and expected outcomes were discussed with the patient.  The patient concurred with the proposed plan, giving informed consent.  The site of surgery properly noted/marked. The patient was taken to the Operating Room, identified as Isidor Holts and the procedure verified as Procedure(s) (LRB): LAPAROSCOPIC BILATERAL SALPINGECTOMY (Bilateral), LAPAROSCOPIC OOPHORECTOMY (Left). A Time Out was held and the above information confirmed.  She was then placed under general anesthesia without difficulty. She was placed in the dorsal lithotomy position, and was prepped and draped in a sterile manner.  A straight catheterization was performed. A sterile speculum was inserted into the vagina and the cervix was grasped at the anterior lip using a single-toothed tenaculum.  The uterus was sounded to 6 cm. A Hulka clamp was placed for uterine manipulation.  The speculum and tenaculum were then removed. After an adequate timeout was performed,  attention was turned to the abdomen where an umbilical incision was made with the scalpel.  The Optiview 11-mm trocar and sleeve were then advanced without difficulty with the laparoscope under direct visualization into the abdomen.  The abdomen was then insufflated with carbon dioxide gas and adequate pneumoperitoneum was obtained. A 5-mm left lower quadrant port and an 5-mm right lower quadrant port were then placed under direct visualization.  A survey of the patient's pelvis and abdomen revealed the findings as above.  The Harmonic scalpel device was used to ligate the dense adhesions of the omentum to the posterior surface of the uterus as there was minimal uterine movement noted due to adhesions. Adhesiolysis was also performed on the right side of the fallopian tube and ovaries to the posterior uterine surface and the pelvic sidewall. On the left side, more adhesions were lysed at the site of the pelvic mass as it was fixed to the posterior uterine wall and left pelvic sidewall. The mass had the appearance of either a pyosalpinx (due to pus-like material exuding from the mass) or tubo-ovarian abscess. There were 2 small fibroids noted to be contained within the left fallopian tube and mesosalpinx. Based on the findings of the fluid content, the decision was made to administer 2 grams of Ancef intravenously. The infundibulopelvic ligament was then clamped and transected with the Harmonic device. The fallopian tube was then clamped and ligated with the Harmonic device. On the right  there was another small fibroid at the isthmic portion of the fallopian tube.  The tube was noted to be previously surgically interrupted.The fallopian tube was transected at the the mesosalpinx using the Harmonic device.   An Endocatch bag was then inserted into the 11 mm trochar and the left ovary and bilateral  tubes were removed.  The 11 mm trochar was then removed. The skin and fascial incision were slightly extended to be able  to remove the specimen. The PMI device with cone was then used to close the 11-mm port site using a 0-VIcryl suture.   The pneumoperitoneum was then re-established, and the laparoscope was then introduced once again into the abdominal cavity.  A final survey was performed, where good hemostasis was noted throughout. All other trocars were removed under direct visualization, and the abdomen which was desufflated.    All skin incisions were closed with 4-0 Vicryl subcuticular stitches. Dermabond was placed over the incisions.  A total of 20 ml of 0.5% Sensorcaine was injected into the incisions. The patient tolerated the procedures well.  All instruments, needles, and sponge counts were correct x 2. The patient was taken to the recovery room awake, extubated and in stable condition.   Approximately 30 minutes of the total case (>50%) was used to perform adhesiolysis.    Estimated Blood Loss:  5 ml      Drains: straight catheterization prior to procedure with  100 ml of clear urine         Total IV Fluids:  750 ml  Specimens: Bilateral fallopian tubes, ovary with pelvic mass         Implants: None         Complications:  None; patient tolerated the procedure well.         Disposition: PACU - hemodynamically stable.         Condition: stable   Hildred Laser, MD [facility]

## 2020-07-13 LAB — SURGICAL PATHOLOGY

## 2020-07-13 NOTE — Anesthesia Postprocedure Evaluation (Signed)
Anesthesia Post Note  Patient: Annette Wallace  Procedure(s) Performed: LAPAROSCOPIC BILATERAL SALPINGECTOMY (Bilateral) LAPAROSCOPIC OOPHORECTOMY (Left)  Patient location during evaluation: PACU Anesthesia Type: General Level of consciousness: awake and alert Pain management: pain level controlled Vital Signs Assessment: post-procedure vital signs reviewed and stable Respiratory status: spontaneous breathing, nonlabored ventilation, respiratory function stable and patient connected to nasal cannula oxygen Cardiovascular status: blood pressure returned to baseline and stable Postop Assessment: no apparent nausea or vomiting Anesthetic complications: no   No notable events documented.   Last Vitals:  Vitals:   07/12/20 1015 07/12/20 1029  BP: (!) 115/54 104/76  Pulse: 61 65  Resp: 10 14  Temp: 36.9 C 36.8 C  SpO2: 100% 99%    Last Pain:  Vitals:   07/13/20 0849  TempSrc:   PainSc: 3                  Lenard Simmer

## 2020-07-21 ENCOUNTER — Other Ambulatory Visit: Payer: Self-pay

## 2020-07-21 ENCOUNTER — Encounter: Payer: Self-pay | Admitting: Obstetrics and Gynecology

## 2020-07-21 ENCOUNTER — Ambulatory Visit (INDEPENDENT_AMBULATORY_CARE_PROVIDER_SITE_OTHER): Payer: BLUE CROSS/BLUE SHIELD | Admitting: Obstetrics and Gynecology

## 2020-07-21 VITALS — BP 170/68 | HR 68 | Ht 62.0 in | Wt 151.3 lb

## 2020-07-21 DIAGNOSIS — N809 Endometriosis, unspecified: Secondary | ICD-10-CM

## 2020-07-21 DIAGNOSIS — I10 Essential (primary) hypertension: Secondary | ICD-10-CM

## 2020-07-21 DIAGNOSIS — R519 Headache, unspecified: Secondary | ICD-10-CM

## 2020-07-21 DIAGNOSIS — Z90721 Acquired absence of ovaries, unilateral: Secondary | ICD-10-CM

## 2020-07-21 DIAGNOSIS — Z9079 Acquired absence of other genital organ(s): Secondary | ICD-10-CM

## 2020-07-21 DIAGNOSIS — Z09 Encounter for follow-up examination after completed treatment for conditions other than malignant neoplasm: Secondary | ICD-10-CM

## 2020-07-21 MED ORDER — HYDROCHLOROTHIAZIDE 25 MG PO TABS: 25.0000 mg | ORAL_TABLET | Freq: Every day | ORAL | 3 refills | Status: DC

## 2020-07-21 NOTE — Progress Notes (Signed)
Pt present for post op visit. Pain in the left side, headaches, and elevated bp.

## 2020-07-21 NOTE — Progress Notes (Signed)
    OBSTETRICS/GYNECOLOGY POST-OPERATIVE CLINIC VISIT  Subjective:     Annette Wallace is a 55 y.o. G56P2012 female who presents to the clinic 1.5 weeks status post left oophorectomy and bilateral salpingectomy with lysis of adhesions for adnexal mass. Eating a regular diet without difficulty. Bowel movements are normal. Pain is controlled without any medications.  Notes mostly soreness in left lower pelvis.   The following portions of the patient's history were reviewed and updated as appropriate: allergies, current medications, past family history, past medical history, past social history, past surgical history, and problem list.  Review of Systems A comprehensive review of systems was negative except for: Neurological: positive for headaches for several months, intermittently    Objective:  Initial BP 173/81  BP (!) 170/68   Pulse 68   Ht 5\' 2"  (1.575 m)   Wt 151 lb 4.8 oz (68.6 kg)   LMP 05/30/2017   BMI 27.67 kg/m  General:  alert and no distress  Abdomen: soft, bowel sounds active, non-tender  Incision:   healing well, no drainage, no erythema, no hernia, no seroma, no swelling, no dehiscence, incision well approximated    Pathology:  A. OVARY, LEFT AND BILATERAL FALLOPIAN TUBES; OOPHORECTOMY AND BILATERAL  SALPINGECTOMY:  - LEFT OVARY AND FALLOPIAN TUBE WITH TUBO-OVARIAN ADHESIONS.  - ENDOMETRIOSIS.  - CHRONIC SALPINGITIS.  - BENIGN RIGHT FALLOPIAN TUBE  - BENIGN PARATUBAL CYSTS   Assessment:    Doing well postoperatively. S/p   Elevated blood pressures, diagnosis HTN  Headaches Endometriosis  Plan:   1. Continue any current medications as needed. 2. Wound care discussed. 3. Operative findings reviewed. Pathology report discussed.  Answered patient's questions regarding endometriosis.  4. Activity restrictions: none. Advance activity as tolerated.  5. Anticipated return to work:  now, if applicable . 6. Reviewed patient's BPs. Have been elevated over past  several visits. Patient notes elevations for at least 3-4 months. Notes she initially thought it was due to her pelvic pain, however now that she has had surgery and her pain is better her blood pressures are still noted to be elevated.  Patient also has been experiencing intermittent headaches for at least 4 to 6 months.  She is unsure if this is also related to her blood pressures.  Discussed that it is very possible that the 2 are related.  I discussed lifestyle interventions such as dietary modifications (low sodium intake) and increasing physical activity and exercise.  Patient notes that she is already fairly active, however does take in a lot of sodium.  Also discussed the option of medication as she continues to adopt changes in her lifestyle to better control her blood pressures.  Patient is okay to initiate.  Will start on 25 mg HCTZ.  Advised that she could follow-up with her PCP for further management or could return to GYN office.  Patient prefers to return to [facility].  We will follow-up BPs in 1 month. 7. Follow up: 1  month for BP check.       06/01/2017, MD [facility]

## 2020-07-22 ENCOUNTER — Telehealth: Payer: Self-pay | Admitting: Obstetrics and Gynecology

## 2020-07-22 NOTE — Telephone Encounter (Signed)
Not a typical reaction to that medication. She has the option to start at a lower dose (break the medication in half), or I can send her in something different.   Dr. Valentino Saxon

## 2020-07-22 NOTE — Telephone Encounter (Signed)
Pt was seen yesterday, pt started new prescription medication, states it did not sit well with her. Pt states that she felt like her heart was going to beat out of her chest- felt like heart attack. Pt states felt blood pressure felt high. Headache that is still current. Pt is needing advise on what to do. Please Advise.

## 2020-07-22 NOTE — Patient Instructions (Signed)

## 2020-07-23 NOTE — Telephone Encounter (Signed)
Spoke to pt concerning her call to the office about her new medication giving her a headache. Informed pt of the information given by [facility] that she could take a half the medication daily to see if that would help if not she could try another form of medication. Pt stated that she would try cutting the pill in half and taking half the dose. Pt was advised that if she was stilling having headaches to please let us know. Pt voiced that she understood.

## 2020-07-23 NOTE — Telephone Encounter (Signed)
FYI

## 2020-08-18 ENCOUNTER — Encounter: Payer: Self-pay | Admitting: Obstetrics and Gynecology

## 2020-08-18 ENCOUNTER — Ambulatory Visit (INDEPENDENT_AMBULATORY_CARE_PROVIDER_SITE_OTHER): Payer: Commercial Managed Care - PPO | Admitting: Obstetrics and Gynecology

## 2020-08-18 ENCOUNTER — Other Ambulatory Visit: Payer: Self-pay

## 2020-08-18 VITALS — BP 169/97 | HR 73 | Ht 62.0 in | Wt 148.9 lb

## 2020-08-18 DIAGNOSIS — I10 Essential (primary) hypertension: Secondary | ICD-10-CM | POA: Diagnosis not present

## 2020-08-18 MED ORDER — METOPROLOL SUCCINATE ER 50 MG PO TB24
50.0000 mg | ORAL_TABLET | Freq: Every day | ORAL | 3 refills | Status: DC
Start: 2020-08-18 — End: 2021-09-28

## 2020-08-18 NOTE — Progress Notes (Signed)
    GYNECOLOGY PROGRESS NOTE  Subjective:    Patient ID: Annette Wallace, female    DOB: 1965/07/06, 55 y.o.   MRN: [id number]  HPI  Patient is a 55 y.o. G15P2012 female who presents for 1 month blood pressure check. She is not currently on any blood pressure medications. She was initiated on HCTZ last visit but she is no longer taking it because it caused worsening palpitations. She tired taking half of the dosage and titrate up, but her symptoms did not resolve, so she stopped medication. She is still noting having does have headaches and palpitations.  Notes that she has a friend who has similar symptoms and was put on 2 medications (Propranolol and Amlodipine). Wonders if this may work for her too.   Patient also has further questions about her pathology from her recent surgery.   The following portions of the patient's history were reviewed and updated as appropriate: allergies, current medications, past family history, past medical history, past social history, past surgical history, and problem list.  Review of Systems Pertinent items noted in HPI and remainder of comprehensive ROS otherwise negative.   Objective:   Blood pressure (!) 169/97, pulse 73, height 5\' 2"  (1.575 m), weight 148 lb 14.4 oz (67.5 kg), last menstrual period 05/30/2017.  Body mass index is 27.23 kg/m.   General appearance: alert and no distress Neurologic: Grossly normal   Assessment:   1. Essential hypertension     Plan:   Discussed different options for management. Side effects noted with HCTZ so discontinued. I believe that Metoprolol would be a good treatment option for patient. Will prescribe.  Reviewed pathology report again with patient, answered all questions.   RTC in 1 month for f/u BP check.    A total of 15 minutes were spent face-to-face with the patient during this encounter and over half of that time dealt with counseling and coordination of care.   06/01/2017, MD [facility]

## 2020-08-18 NOTE — Patient Instructions (Signed)
Metoprolol Tablets What is this medication? METOPROLOL (me TOE proe lole) treats high blood pressure. It also prevents chest pain (angina) or further damage after a heart attack. It works by lowering your blood pressure and heart rate, making it easier for your heart to pump blood to the rest of your body. It belongs to a group of medicationscalled beta blockers. This medicine may be used for other purposes; ask your health care provider orpharmacist if you have questions. COMMON BRAND NAME(S): Lopressor What should I tell my care team before I take this medication? They need to know if you have any of these conditions: Diabetes Heart or vessel disease like slow heart rate, worsening heart failure, heart block, sick sinus syndrome, or Raynaud's disease Kidney disease Liver disease Lung or breathing disease, like asthma or emphysema Pheochromocytoma Thyroid disease An unusual or allergic reaction to metoprolol, other beta blockers, medications, foods, dyes, or preservatives Pregnant or trying to get pregnant Breast-feeding How should I use this medication? Take this medication by mouth with water. Take it as directed on the prescription label at the same time every day. You can take it with or without food. You should always take it the same way. Keep taking it unless your careteam tells you to stop. Talk to your care team about the use of this medication in children. Specialcare may be needed. Overdosage: If you think you have taken too much of this medicine contact apoison control center or emergency room at once. NOTE: This medicine is only for you. Do not share this medicine with others. What if I miss a dose? If you miss a dose, take it as soon as you can. If it is almost time for yournext dose, take only that dose. Do not take double or extra doses. What may interact with this medication? This medication may interact with the following: Certain medications for blood pressure, heart  disease, irregular heartbeat Certain medications for depression like monoamine oxidase (MAO) inhibitors, fluoxetine, or paroxetine Clonidine Dobutamine Epinephrine Isoproterenol Reserpine This list may not describe all possible interactions. Give your health care provider a list of all the medicines, herbs, non-prescription drugs, or dietary supplements you use. Also tell them if you smoke, drink alcohol, or use illegaldrugs. Some items may interact with your medicine. What should I watch for while using this medication? Visit your care team for regular checks on your progress. Check your blood pressure as directed. Ask your care team what your blood pressure should be.Also, find out when you should contact them. Do not treat yourself for coughs, colds, or pain while you are using this medication without asking your care team for advice. Some medications mayincrease your blood pressure. You may get drowsy or dizzy. Do not drive, use machinery, or do anything that needs mental alertness until you know how this medication affects you. Do not stand up or sit up quickly, especially if you are an older patient. This reduces the risk of dizzy or fainting spells. Alcohol may interfere with theeffect of this medication. Avoid alcoholic drinks. This medication may increase blood sugar. Ask your care team if changes in dietor medications are needed if you have diabetes. What side effects may I notice from receiving this medication? Side effects that you should report to your care team as soon as possible: Allergic reactions-skin rash, itching, hives, swelling of the face, lips, tongue, or throat Heart failure-shortness of breath, swelling of the ankles, feet, or hands, sudden weight gain, unusual weakness or fatigue Low blood  pressure-dizziness, feeling faint or lightheaded, blurry vision Raynaud's-cool, numb, or painful fingers or toes that may change color from pale, to blue, to red Slow  heartbeat-dizziness, feeling faint or lightheaded, confusion, trouble breathing, unusual weakness or fatigue Worsening mood, feelings of depression Side effects that usually do not require medical attention (report to your careteam if they continue or are bothersome): Change in sex drive or performance Diarrhea Dizziness Fatigue Headache This list may not describe all possible side effects. Call your doctor for medical advice about side effects. You may report side effects to FDA at1-800-FDA-1088. Where should I keep my medication? Keep out of the reach of children and pets. Store at room temperature between 15 and 30 degrees C (59 and 86 degrees F). Protect from moisture. Keep the container tightly closed. Throw away any unusedmedication after the expiration date. NOTE: This sheet is a summary. It may not cover all possible information. If you have questions about this medicine, talk to your doctor, pharmacist, orhealth care provider.  2022 Elsevier/Gold Standard (2020-02-05 13:08:14)

## 2020-09-17 ENCOUNTER — Other Ambulatory Visit: Payer: Self-pay

## 2020-09-17 ENCOUNTER — Encounter: Payer: Self-pay | Admitting: Obstetrics and Gynecology

## 2020-09-17 ENCOUNTER — Ambulatory Visit: Payer: Commercial Managed Care - PPO | Admitting: Obstetrics and Gynecology

## 2020-09-17 VITALS — BP 177/81 | HR 74 | Ht 62.0 in | Wt 154.1 lb

## 2020-09-17 DIAGNOSIS — I10 Essential (primary) hypertension: Secondary | ICD-10-CM

## 2020-09-17 DIAGNOSIS — I739 Peripheral vascular disease, unspecified: Secondary | ICD-10-CM

## 2020-09-17 DIAGNOSIS — Z0130 Encounter for examination of blood pressure without abnormal findings: Secondary | ICD-10-CM

## 2020-09-17 NOTE — Progress Notes (Signed)
    GYNECOLOGY PROGRESS NOTE  Subjective:    Patient ID: Annette Wallace, female    DOB: 1965/06/12, 55 y.o.   MRN: [id number]  HPI  Patient is a 55 y.o. C1Y6063 female who presents for blood pressure check. Patient is currently on Toprol XL 50mg . Patient stated that she was not aware that after one week of taking 1/2 tablet daily; she was to increase to 1 tablet a daily so she did not titrate up on the dose. Previously was changed from HCTZ to Toprol XL due to side effects last visit. Today she complains that since starting Toprol she has experienced drowsiness.  Switched to taking it in the evenings, but is experiencing issues with increased hunger and occasional nightmares.     Of note, patient also desires to mention that over the past year she has noted some issues with a pain or aching in the back of her legs when she walks/exercises.  Does note feel like "typical muscle cramps after working out". Noes that up until this past year she used to exercise regularly. Would like to resume but is concerned about her symptoms.   The following portions of the patient's history were reviewed and updated as appropriate: allergies, current medications, past family history, past medical history, past social history, past surgical history, and problem list.  Review of Systems Pertinent items noted in HPI and remainder of comprehensive ROS otherwise negative.   Objective:   Blood pressure (!) 177/81, pulse 74, height 5\' 2"  (1.575 m), weight 154 lb 1.6 oz (69.9 kg), last menstrual period 05/30/2017.  Body mass index is 28.19 kg/m. General appearance: alert, cooperative, appears stated age, and no distress Extremities: extremities normal, atraumatic, no cyanosis or edema Neurologic: Grossly normal   Assessment:   1. Blood pressure check   2. Essential hypertension   3. Claudication of both lower extremities (HCC)      Plan:   Reiterated instructions for taking BP medication Advised that if she  continues to experience side effects with the medication, she may want to return to her PCP for further management of her HTN.  Unclear if patient is experiencing claudication due to her HTN, or other cause. Advised that once BPs are better controlled, if still experiencing symptoms, she should consider further evaluation with a Vascular specialist.   RTC in 1 month for BP check.    , MD [facility]

## 2020-10-15 NOTE — Progress Notes (Signed)
    GYNECOLOGY PROGRESS NOTE  Subjective:    Patient ID: Annette Wallace, female    DOB: 1965-09-11, 55 y.o.   MRN: [id number]  HPI  Patient is a 55 y.o. F8H8299 female who presents for blood pressure check. She denies having chest pain, sob, dizziness, palpitations or visual disturbances. She reports that her blood pressure today is much better than it was previously. She has also changed her eating habits and has been exercising to get her blood pressure back to normal range for the past 2 weeks. She has lost 8 lbs since her last visit. She has been taking her Metoprolol 50 mg as prescribed with no missed doses. She notes side effect of feeling tired with medication but otherwise doing well. Bought a BP cuff at home.   The following portions of the patient's history were reviewed and updated as appropriate: allergies, current medications, past family history, past medical history, past social history, past surgical history, and problem list.  Review of Systems Pertinent items noted in HPI and remainder of comprehensive ROS otherwise negative.   Objective:   Vitals with BMI 10/19/2020 09/17/2020 08/18/2020  Height 5\' 3"  5\' 2"  -  Weight 146 lbs 5 oz 154 lbs 2 oz -  BMI 25.92 28.18 -  Systolic 144 177  Diastolic 80 81 97  Pulse 65 74 73    General appearance: alert and no distress CVS: RRR, no murmurs/rubs Pulmonary: CTAB  Assessment:   1. Essential hypertension     Plan:   Patient's blood pressures have greatly improved on medication. Continue to monitor.  Continue to encourage lifestyle changes.  Patient desires to be able to maintain with this alone. Can recheck BPs in 2-3 months.     , MD [facility].

## 2020-10-16 ENCOUNTER — Other Ambulatory Visit: Payer: Self-pay | Admitting: Obstetrics and Gynecology

## 2020-10-19 ENCOUNTER — Other Ambulatory Visit: Payer: Self-pay

## 2020-10-19 ENCOUNTER — Encounter: Payer: Self-pay | Admitting: Obstetrics and Gynecology

## 2020-10-19 ENCOUNTER — Ambulatory Visit: Payer: Commercial Managed Care - PPO | Admitting: Obstetrics and Gynecology

## 2020-10-19 VITALS — BP 144/80 | HR 65 | Resp 16 | Ht 63.0 in | Wt 146.3 lb

## 2020-10-19 DIAGNOSIS — I10 Essential (primary) hypertension: Secondary | ICD-10-CM | POA: Diagnosis not present

## 2020-11-05 ENCOUNTER — Telehealth: Payer: Self-pay | Admitting: Obstetrics and Gynecology

## 2020-11-05 NOTE — Telephone Encounter (Signed)
Pt is requesting a vascular referral.

## 2020-11-05 NOTE — Telephone Encounter (Signed)
Annette Wallace states she had spoken with Dr. Valentino Saxon about a vascular referral.  Annette Wallace states things are getting worse and would really like a referral.  Please advise.

## 2020-11-05 NOTE — Telephone Encounter (Signed)
Spoke to pt concerning her call to the office. Informed pt that I was not sure of what type of referral that she needed and would sending her message to Dr. Valentino Saxon. Informed her that [facility] was not in the office today and would not return until Tuesday and to please allow [place] that time to reach back to her. Pt voiced that she understood.

## 2020-11-08 NOTE — Telephone Encounter (Signed)
Pt reported BP is good; pain in both legs mainly calf of legs. Noticed about 2 years ago symptoms getting worse.   Thanks [employer]

## 2020-11-08 NOTE — Telephone Encounter (Signed)
Spoke to pt she stated that her BP was wonderful. Pt reported pain in both of her legs mainly the calf of the both legs. Pt report when she walks she has a lot of pain and her legs hurt noticed about 2 years ago but the symptoms are getting worst.

## 2020-11-10 ENCOUNTER — Other Ambulatory Visit: Payer: Self-pay

## 2020-11-10 DIAGNOSIS — I739 Peripheral vascular disease, unspecified: Secondary | ICD-10-CM

## 2020-11-10 NOTE — Telephone Encounter (Signed)
Pt called no answer LM on voicemail informing pt that her referral to a vascular doctor has been placed. Pt was made aware by vm that it should take up to 14 days to process the referral and to please contact the office in 7 days if she do not hear from the referral office.

## 2020-12-27 ENCOUNTER — Other Ambulatory Visit (INDEPENDENT_AMBULATORY_CARE_PROVIDER_SITE_OTHER): Payer: Self-pay | Admitting: Nurse Practitioner

## 2020-12-27 ENCOUNTER — Ambulatory Visit (INDEPENDENT_AMBULATORY_CARE_PROVIDER_SITE_OTHER): Payer: Commercial Managed Care - PPO

## 2020-12-27 ENCOUNTER — Encounter (INDEPENDENT_AMBULATORY_CARE_PROVIDER_SITE_OTHER): Payer: Self-pay | Admitting: Vascular Surgery

## 2020-12-27 ENCOUNTER — Other Ambulatory Visit: Payer: Self-pay

## 2020-12-27 ENCOUNTER — Ambulatory Visit (INDEPENDENT_AMBULATORY_CARE_PROVIDER_SITE_OTHER): Payer: BLUE CROSS/BLUE SHIELD | Admitting: Vascular Surgery

## 2020-12-27 VITALS — BP 136/73 | HR 59 | Resp 14 | Ht 62.0 in | Wt 135.0 lb

## 2020-12-27 DIAGNOSIS — M79605 Pain in left leg: Secondary | ICD-10-CM

## 2020-12-27 DIAGNOSIS — M79604 Pain in right leg: Secondary | ICD-10-CM

## 2020-12-27 DIAGNOSIS — E040 Nontoxic diffuse goiter: Secondary | ICD-10-CM | POA: Insufficient documentation

## 2020-12-27 DIAGNOSIS — I739 Peripheral vascular disease, unspecified: Secondary | ICD-10-CM | POA: Diagnosis not present

## 2020-12-27 DIAGNOSIS — K5730 Diverticulosis of large intestine without perforation or abscess without bleeding: Secondary | ICD-10-CM | POA: Insufficient documentation

## 2020-12-27 DIAGNOSIS — N9489 Other specified conditions associated with female genital organs and menstrual cycle: Secondary | ICD-10-CM | POA: Insufficient documentation

## 2020-12-27 DIAGNOSIS — I70213 Atherosclerosis of native arteries of extremities with intermittent claudication, bilateral legs: Secondary | ICD-10-CM

## 2020-12-27 DIAGNOSIS — N959 Unspecified menopausal and perimenopausal disorder: Secondary | ICD-10-CM | POA: Insufficient documentation

## 2020-12-27 DIAGNOSIS — K219 Gastro-esophageal reflux disease without esophagitis: Secondary | ICD-10-CM

## 2020-12-27 DIAGNOSIS — B0221 Postherpetic geniculate ganglionitis: Secondary | ICD-10-CM | POA: Insufficient documentation

## 2020-12-27 DIAGNOSIS — E559 Vitamin D deficiency, unspecified: Secondary | ICD-10-CM | POA: Insufficient documentation

## 2020-12-27 DIAGNOSIS — R7301 Impaired fasting glucose: Secondary | ICD-10-CM | POA: Insufficient documentation

## 2020-12-27 DIAGNOSIS — G479 Sleep disorder, unspecified: Secondary | ICD-10-CM | POA: Insufficient documentation

## 2020-12-27 DIAGNOSIS — E7800 Pure hypercholesterolemia, unspecified: Secondary | ICD-10-CM | POA: Insufficient documentation

## 2020-12-27 DIAGNOSIS — G510 Bell's palsy: Secondary | ICD-10-CM | POA: Insufficient documentation

## 2020-12-27 NOTE — Progress Notes (Signed)
MRN : [id number]  Annette Wallace is a 55 y.o. (October 24, 1965) female who presents with chief complaint of check legs.  History of Present Illness:    The patient is seen for evaluation of painful lower extremities and diminished pulses. Patient notes the pain is always associated with activity and is very consistent day today. Typically, the pain occurs at less than one block, progress is as activity continues to the point that the patient must stop walking. Resting including standing still for several minutes allowed resumption of the activity and the ability to walk a similar distance before stopping again. Uneven terrain and inclined shorten the distance. The pain has been progressive over the past several years. The patient states the inability to walk is now having a profound negative impact on quality of life and daily activities, " I can't walk and it's driving me insane".  The patient denies rest pain or dangling of an extremity off the side of the bed during the night for relief. No open wounds or sores at this time. No prior interventions or surgeries.  No history of back problems or DJD of the lumbar sacral spine.   The patient denies changes in claudication symptoms or new rest pain symptoms.  No new ulcers or wounds of the foot.  The patient's blood pressure has been stable and relatively well controlled. The patient denies amaurosis fugax or recent TIA symptoms. There are no recent neurological changes noted. The patient denies history of DVT, PE or superficial thrombophlebitis. The patient denies recent episodes of angina or shortness of breath.    ABI's Rt=1.24 and Lt=1.12 with triphasic signal at rest.  There is a sharp decrease with exercise.  Current Meds  Medication Sig   aspirin EC 81 MG tablet Take 81 mg by mouth 2 (two) times a week. Swallow whole.   metoprolol succinate (TOPROL XL) 50 MG 24 hr tablet Take 1 tablet (50 mg total) by mouth daily. Take with or  immediately following a meal. Start with 1/2/ tablet daily for 1 week, then increase to 1 tablet daily.   nicotine (NICODERM CQ - DOSED IN MG/24 HOURS) 14 mg/24hr patch Place 14 mg onto the skin daily as needed (smoking cessation).   sodium chloride (OCEAN) 0.65 % SOLN nasal spray Place 1 spray into both nostrils as needed for congestion.    Past Medical History:  Diagnosis Date   Anxiety    ASCUS with positive high risk HPV cervical 07/2017   Colposcopy negative.  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   Reflux    GERD   Squamous cell carcinoma 06/2007   OF LEFT EYE    Past Surgical History:  Procedure Laterality Date   CERVICAL SPINE SURGERY  2-13   RUPTURED DISCS   LAPAROSCOPIC BILATERAL SALPINGECTOMY Bilateral 07/12/2020   Procedure: LAPAROSCOPIC BILATERAL SALPINGECTOMY;  Surgeon: Rubie Maid, MD;  Location: [facility] ORS;  Service: Gynecology;  Laterality: Bilateral;   TUBAL LIGATION     WIDE EXC OF SQ CELL A OF EYE     LEFT EYE--ALSO, CRYOTHERAPY TO THE MARGINS    Social History Social History   Tobacco Use   Smoking status: Some Days    Packs/day: 0.50    Types: Cigarettes    Last attempt to quit: 01/16/2009    Years since quitting: 11.9   Smokeless tobacco: Never  Vaping  Use   Vaping Use: Never used  Substance Use Topics   Alcohol use: No   Drug use: Never    Family History Family History  Problem Relation Age of Onset   Other Mother        SUICIDE   Heart disease Father    Diabetes Maternal Grandfather    Hypertension Maternal Grandfather    Diabetes Paternal Grandfather    Hypertension Paternal Grandfather     Allergies  Allergen Reactions   Codeine Itching and Rash    "crazy"   Penicillins Rash     REVIEW OF SYSTEMS (Negative unless checked)  Constitutional: [] Weight loss  [] Fever  [] Chills Cardiac: [] Chest pain   [] Chest pressure    [] Palpitations   [] Shortness of breath when laying flat   [] Shortness of breath with exertion. Vascular:  [x] Pain in legs with walking   [] Pain in legs at rest  [] History of DVT   [] Phlebitis   [] Swelling in legs   [] Varicose veins   [] Non-healing ulcers Pulmonary:   [] Uses home oxygen   [] Productive cough   [] Hemoptysis   [] Wheeze  [] COPD   [] Asthma Neurologic:  [] Dizziness   [] Seizures   [] History of stroke   [] History of TIA  [] Aphasia   [] Vissual changes   [] Weakness or numbness in arm   [] Weakness or numbness in leg Musculoskeletal:   [] Joint swelling   [] Joint pain   [] Low back pain Hematologic:  [] Easy bruising  [] Easy bleeding   [] Hypercoagulable state   [] Anemic Gastrointestinal:  [] Diarrhea   [] Vomiting  [x] Gastroesophageal reflux/heartburn   [] Difficulty swallowing. Genitourinary:  [] Chronic kidney disease   [] Difficult urination  [] Frequent urination   [] Blood in urine Skin:  [] Rashes   [] Ulcers  Psychological:  [] History of anxiety   []  History of major depression.  Physical Examination  Vitals:   12/27/20 1039  BP: 136/73  Pulse: (!) 59  Resp: 14  Weight: 135 lb (61.2 kg)  Height: 5' 2"  (1.575 m)   Body mass index is 24.69 kg/m. Gen: WD/WN, NAD Head: [place]/AT, No temporalis wasting.  Ear/Nose/Throat: Hearing grossly intact, nares w/o erythema or drainage Eyes: PER, EOMI, sclera nonicteric.  Neck: Supple, no masses.  No bruit or JVD.  Pulmonary:  Good air movement, no audible wheezing, no use of accessory muscles.  Cardiac: RRR, normal S1, S2, no Murmurs. Vascular:   Vessel Right Left  Radial Palpable Palpable  PT Palpable Palpable  DP Palpable Palpable  Gastrointestinal: soft, non-distended. No guarding/no peritoneal signs.  Musculoskeletal: M/S 5/5 throughout.  No visible deformity.  Neurologic: CN 2-12 intact. Pain and light touch intact in extremities.  Symmetrical.  Speech is fluent. Motor exam as listed above. Psychiatric: Judgment intact, Mood & affect  appropriate for pt's clinical situation. Dermatologic: No rashes or ulcers noted.  No changes consistent with cellulitis.   CBC Lab Results  Component Value Date   WBC 7.2 06/25/2020   HGB 14.1 06/25/2020   HCT 43.0 06/25/2020   MCV 88 06/25/2020   PLT 307 06/25/2020    BMET    Component Value Date/Time   NA 141 06/25/2020 1109   K 4.5 06/25/2020 1109   CL 102 06/25/2020 1109   CO2 25 06/25/2020 1109   GLUCOSE 89 06/25/2020 1109   GLUCOSE 93 07/18/2017 0827   BUN 20 06/25/2020 1109   CREATININE 0.75 06/25/2020 1109   CREATININE 0.77 07/18/2017 0827   CALCIUM 10.0 06/25/2020 1109   GFRNONAA >60 12/18/2009 2014   GFRAA  12/18/2009  2014    >60        The eGFR has been calculated using the MDRD equation. This calculation has not been validated in all clinical situations. eGFR's persistently <60 mL/min signify possible Chronic Kidney Disease.   CrCl cannot be calculated (Patient's most recent lab result is older than the maximum 21 days allowed.).  COAG No results found for: INR, PROTIME  Radiology No results found.   Assessment/Plan 1. Pain in both lower extremities Recommend:  The patient has experienced increased symptoms and is now describing lifestyle limiting claudication and mild rest pain.  ABI's Rt=1.24 and Lt=1.12 with triphasic signal at rest.  There is a sharp decrease with exercise.  This suggests a hemodynamically significant stenosis with activity.  Given the severity of the patient's lower extremity symptoms the patient should undergo CT angiography to plan possible intervention.  Risk and benefits were reviewed the patient.  Indications for the procedure were reviewed.  All questions were answered, the patient agrees to proceed.   The patient should continue walking and begin a more formal exercise program.   The patient should continue antiplatelet therapy and aggressive treatment of the lipid abnormalities  The patient will follow up with me  after the CT angiogram.    - CT ANGIO AO+BIFEM W & OR WO CONTRAST; Future  2. Atherosclerosis of native artery of both lower extremities with intermittent claudication (HCC) Recommend:  The patient has experienced increased symptoms and is now describing lifestyle limiting claudication and mild rest pain.  ABI's Rt=1.24 and Lt=1.12 with triphasic signal at rest.  There is a sharp decrease with exercise.  This suggests a hemodynamically significant stenosis with activity.  Given the severity of the patient's lower extremity symptoms the patient should undergo CT angiography to plan possible intervention.  Risk and benefits were reviewed the patient.  Indications for the procedure were reviewed.  All questions were answered, the patient agrees to proceed.   The patient should continue walking and begin a more formal exercise program.   The patient should continue antiplatelet therapy and aggressive treatment of the lipid abnormalities  The patient will follow up with me after the CT angiogram.    - CT ANGIO AO+BIFEM W & OR WO CONTRAST; Future  3. Gastroesophageal reflux disease without esophagitis Continue PPI as already ordered, this medication has been reviewed and there are no changes at this time.  Avoidence of caffeine and alcohol  Moderate elevation of the head of the bed    4. High cholesterol Continue statin as ordered and reviewed, no changes at this time     Hortencia Pilar, MD  12/27/2020 10:48 AM

## 2021-01-01 ENCOUNTER — Encounter (INDEPENDENT_AMBULATORY_CARE_PROVIDER_SITE_OTHER): Payer: Self-pay | Admitting: Vascular Surgery

## 2021-01-01 DIAGNOSIS — M79606 Pain in leg, unspecified: Secondary | ICD-10-CM | POA: Insufficient documentation

## 2021-01-01 DIAGNOSIS — I70219 Atherosclerosis of native arteries of extremities with intermittent claudication, unspecified extremity: Secondary | ICD-10-CM | POA: Insufficient documentation

## 2021-01-13 ENCOUNTER — Telehealth (INDEPENDENT_AMBULATORY_CARE_PROVIDER_SITE_OTHER): Payer: Self-pay

## 2021-01-13 NOTE — Telephone Encounter (Signed)
Received a call from Annette Wallace she has not heard back from anyone about a scan she was supposed to have scheduled. Asking for a returned phone call.

## 2021-01-13 NOTE — Telephone Encounter (Signed)
Patient will contacting radiology to schedule for CT and then she will contact the office to schedule follow up with Dr Gilda Crease to go over results

## 2021-01-19 ENCOUNTER — Ambulatory Visit: Payer: Commercial Managed Care - PPO | Admitting: Obstetrics and Gynecology

## 2021-01-19 ENCOUNTER — Other Ambulatory Visit: Payer: Self-pay

## 2021-01-19 ENCOUNTER — Encounter: Payer: Self-pay | Admitting: Obstetrics and Gynecology

## 2021-01-19 VITALS — BP 138/88 | HR 86 | Ht 62.0 in | Wt 134.0 lb

## 2021-01-19 DIAGNOSIS — I10 Essential (primary) hypertension: Secondary | ICD-10-CM

## 2021-01-19 NOTE — Progress Notes (Signed)
° ° °  GYNECOLOGY PROGRESS NOTE  Subjective:    Patient ID: Annette Wallace, female    DOB: October 08, 1965, 56 y.o.   MRN: [id number]  HPI  Patient is a 56 y.o. [id number] female who presents for blood pressure check. She denies having chest pain, sob, dizziness, palpitations. She has been taking her Metoprolol 50 mg as prescribed with no missed doses. Checks her BPs at home. Lost weight, 20 lbs, adopting healthier lifestyle. Did have some issues over the holidays as she had eliminated sugar and bread but then began consuming again. Is now working to remove from her diet again.  Notes her BPs significantly improved but starting climbing again when her diet changed.   Notes that she went to a vein specialist after her last visit due to symptoms of claudication, saw some possible occlusion, plans for CT scan this week.   The following portions of the patient's history were reviewed and updated as appropriate: allergies, current medications, past family history, past medical history, past social history, past surgical history, and problem list.  Review of Systems Pertinent items noted in HPI and remainder of comprehensive ROS otherwise negative.   Objective:   Vitals with BMI 01/19/2021 12/27/2020 10/19/2020  Height 5\' 2"  5\' 2"  5\' 3"   Weight 134 lbs 135 lbs 146 lbs 5 oz  BMI 24.5 24.69 25.92  Systolic 138 136  Diastolic 88 73 80  Pulse 86 59 65    General appearance: alert and no distress CVS: RRR, no murmurs/rubs Pulmonary: CTAB  Assessment:   1. Essential hypertension     Plan:   Patient's blood pressures have greatly improved on medication and lifestyle changes. Continue to monitor.  Desires to f/u in 3 months. By that time, would like to try weaning from medication and just manage with lifestyle/dietary changes.    , MD [facility].

## 2021-01-21 ENCOUNTER — Ambulatory Visit (HOSPITAL_COMMUNITY)
Admission: RE | Admit: 2021-01-21 | Discharge: 2021-01-21 | Disposition: A | Discharge status: Home / Self Care | Payer: Commercial Managed Care - PPO | Source: Ambulatory Visit | Attending: Vascular Surgery | Admitting: Vascular Surgery

## 2021-01-21 ENCOUNTER — Other Ambulatory Visit: Payer: Self-pay

## 2021-01-21 DIAGNOSIS — M79605 Pain in left leg: Secondary | ICD-10-CM | POA: Insufficient documentation

## 2021-01-21 DIAGNOSIS — M79604 Pain in right leg: Secondary | ICD-10-CM | POA: Diagnosis not present

## 2021-01-21 DIAGNOSIS — I70213 Atherosclerosis of native arteries of extremities with intermittent claudication, bilateral legs: Secondary | ICD-10-CM | POA: Diagnosis present

## 2021-01-21 LAB — POCT I-STAT CREATININE: Creatinine, Ser: 0.7 mg/dL (ref 0.44–1.00)

## 2021-01-21 MED ORDER — IOHEXOL 350 MG/ML SOLN
100.0000 mL | Freq: Once | INTRAVENOUS | Status: AC | PRN
  Administered 2021-01-21: 100 mL via INTRAVENOUS

## 2021-01-21 MED ORDER — SODIUM CHLORIDE (PF) 0.9 % IJ SOLN
INTRAMUSCULAR | Status: AC
  Filled 2021-01-21: qty 50

## 2021-02-08 ENCOUNTER — Encounter (INDEPENDENT_AMBULATORY_CARE_PROVIDER_SITE_OTHER): Payer: Self-pay | Admitting: Vascular Surgery

## 2021-02-09 NOTE — Progress Notes (Signed)
MRN : [id number]  Annette Wallace is a 56 y.o. (25-May-1965) female who presents with chief complaint of follow up CT scan.  History of Present Illness:   The patient returns to the office for followup and review status post CT angiogram.  CT angiogram was ordered after the patient described some atypical pain in the lower extremities associated with activity and the exercise portion of the ABIs demonstrated a moderate drop-off in the pressures.  The patient notes no improvement in the lower extremity symptoms. No interval shortening of the patient's claudication distance or rest pain symptoms.  No new ulcers or wounds have occurred since the last visit.  There have been no significant changes to the patient's overall health care.  The patient denies amaurosis fugax or recent TIA symptoms. There are no recent neurological changes noted. The patient denies history of DVT, PE or superficial thrombophlebitis. The patient denies recent episodes of angina or shortness of breath.   Previous ABI's Rt=1.24 and Lt=1.12 with triphasic signal at rest.  There is a moderate decrease with exercise.  CT angiogram is reviewed by myself and there are no stenosis noted of the lower extremities.  Moderate narrowing of the celiac which appear nonatherosclerotic and is asymptomatic  No outpatient medications have been marked as taking for the 02/10/21 encounter (Appointment) with Delana Meyer, Dolores Lory, MD.    Past Medical History:  Diagnosis Date   Anxiety    ASCUS with positive high risk HPV cervical 07/2017   Colposcopy negative.  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   Reflux    GERD   Squamous cell carcinoma 06/2007   OF LEFT EYE    Past Surgical History:  Procedure Laterality Date   CERVICAL SPINE SURGERY  2-13   RUPTURED DISCS   LAPAROSCOPIC BILATERAL SALPINGECTOMY Bilateral 07/12/2020    Procedure: LAPAROSCOPIC BILATERAL SALPINGECTOMY;  Surgeon: Rubie Maid, MD;  Location: [facility] ORS;  Service: Gynecology;  Laterality: Bilateral;   TUBAL LIGATION     WIDE EXC OF SQ CELL A OF EYE     LEFT EYE--ALSO, CRYOTHERAPY TO THE MARGINS    Social History Social History   Tobacco Use   Smoking status: Some Days    Packs/day: 0.50    Types: Cigarettes    Last attempt to quit: 01/16/2009    Years since quitting: 12.0   Smokeless tobacco: Never  Vaping Use   Vaping Use: Never used  Substance Use Topics   Alcohol use: No   Drug use: Never    Family History Family History  Problem Relation Age of Onset   Other Mother        SUICIDE   Heart disease Father    Diabetes Maternal Grandfather    Hypertension Maternal Grandfather    Diabetes Paternal Grandfather    Hypertension Paternal Grandfather     Allergies  Allergen Reactions   Codeine Itching and Rash    "crazy"   Penicillins Rash     REVIEW OF SYSTEMS (Negative unless checked)  Constitutional: _0 Weight loss  _1 Fever  _2 Chills Cardiac: _3 Chest pain   _4 Chest pressure   _5 Palpitations   _6 Shortness of breath when laying flat   _7 Shortness of breath with exertion. Vascular:  _8 Pain in legs with walking   _9 Pain in legs at rest  _10 History of DVT   _11 Phlebitis   _12 Swelling in legs   _13 Varicose  veins   _0 Non-healing ulcers Pulmonary:   _1 Uses home oxygen   _2 Productive cough   _3 Hemoptysis   _4 Wheeze  _5 COPD   _6 Asthma Neurologic:  _7 Dizziness   _8 Seizures   _9 History of stroke   _10 History of TIA  _11 Aphasia   _12 Vissual changes   _13 Weakness or numbness in arm   _14 Weakness or numbness in leg Musculoskeletal:   _15 Joint swelling   _16 Joint pain   _17 Low back pain Hematologic:  _18 Easy bruising  _19 Easy bleeding   _20 Hypercoagulable state   _21 Anemic Gastrointestinal:  _22 Diarrhea   _23 Vomiting  _24 Gastroesophageal reflux/heartburn   _25 Difficulty swallowing. Genitourinary:  _26 Chronic kidney disease   _27 Difficult urination   _28 Frequent urination   _29 Blood in urine Skin:  _30 Rashes   _31 Ulcers  Psychological:  _32 History of anxiety   _33  History of major depression.  Physical Examination  There were no vitals filed for this visit. There is no height or weight on file to calculate BMI. Gen: WD/WN, NAD Head: [place]/AT, No temporalis wasting.  Ear/Nose/Throat: Hearing grossly intact, nares w/o erythema or drainage Eyes: PER, EOMI, sclera nonicteric.  Neck: Supple, no masses.  No bruit or JVD.  Pulmonary:  Good air movement, no audible wheezing, no use of accessory muscles.  Cardiac: RRR, normal S1, S2, no Murmurs. Vascular:   Vessel Right Left  Radial Palpable Palpable  PT Palpable Palpable  DP Palpable Palpable  Gastrointestinal: soft, non-distended. No guarding/no peritoneal signs.  Musculoskeletal: M/S 5/5 throughout.  No visible deformity.  Neurologic: CN 2-12 intact. Pain and light touch intact in extremities.  Symmetrical.  Speech is fluent. Motor exam as listed above. Psychiatric: Judgment intact, Mood & affect appropriate for pt's clinical situation. Dermatologic: No rashes or ulcers noted.  No changes consistent with cellulitis.   CBC Lab Results  Component Value Date   WBC 7.2 06/25/2020   HGB 14.1 06/25/2020   HCT 43.0 06/25/2020   MCV 88 06/25/2020   PLT 307 06/25/2020    BMET    Component Value Date/Time   NA 141 06/25/2020 1109   K 4.5 06/25/2020 1109   CL 102 06/25/2020 1109   CO2 25 06/25/2020 1109   GLUCOSE 89 06/25/2020 1109   GLUCOSE 93 07/18/2017 0827   BUN 20 06/25/2020 1109   CREATININE 0.70 01/21/2021 0747   CREATININE 0.77 07/18/2017 0827   CALCIUM 10.0 06/25/2020 1109   GFRNONAA >60 12/18/2009 2014   GFRAA  12/18/2009 2014    >60        The eGFR has been calculated using the MDRD equation. This calculation has not been validated in all clinical situations. eGFR's persistently <60 mL/min signify possible Chronic Kidney Disease.   CrCl cannot be calculated (Unknown  ideal weight.).  COAG No results found for: INR, PROTIME  Radiology CT ANGIO AO+BIFEM W & OR WO CONTRAST  Result Date: 01/21/2021 CLINICAL DATA:  Claudication Bilateral lower extremity pain Atherosclerosis of native arteries of lower extremities Bilateral calf pain Decreased exercise tolerance EXAM: CT ANGIOGRAPHY OF ABDOMINAL AORTA WITH ILIOFEMORAL RUNOFF TECHNIQUE: Multidetector CT imaging of the abdomen, pelvis and lower extremities was performed using the standard protocol during bolus administration of intravenous contrast. Multiplanar CT image reconstructions and MIPs were obtained to evaluate the vascular anatomy. CONTRAST:  175m OMNIPAQUE IOHEXOL 350 MG/ML SOLN COMPARISON:  None. FINDINGS: VASCULAR Aorta: Mild atheromatous calcified and noncalcified plaque of the infrarenal abdominal aorta without flow-limiting stenosis or aneurysm. Celiac: Moderate stenosis of the origin.  Otherwise patent. SMA: No significant stenosis Renals: No significant stenosis. IMA:  No significant stenosis. RIGHT Lower Extremity Inflow: Common, internal and external iliac arteries are patent without evidence of aneurysm, dissection, vasculitis or significant stenosis. Outflow: Common, superficial and profunda femoral arteries and the popliteal artery are patent without evidence of aneurysm, dissection, vasculitis or significant stenosis. Runoff: Patent three vessel runoff to the ankle. LEFT Lower Extremity Inflow: Common, internal and external iliac arteries are patent without evidence of aneurysm, dissection, vasculitis or significant stenosis. Outflow: Common, superficial and profunda femoral arteries and the popliteal artery are patent without evidence of aneurysm, dissection, vasculitis or significant stenosis. Runoff: Patent three vessel runoff to the ankle. Veins: No obvious venous abnormality within the limitations of this arterial phase study. Review of the MIP images confirms the above findings. NON-VASCULAR Lower  chest: No acute abnormality. Hepatobiliary: No focal liver abnormality is seen. No gallstones, gallbladder wall thickening, or biliary dilatation. Pancreas: Unremarkable. No pancreatic ductal dilatation or surrounding inflammatory changes. Spleen: Normal in size without focal abnormality. Adrenals/Urinary Tract: Adrenal glands are unremarkable. Kidneys are normal, without renal calculi, focal lesion, or hydronephrosis. Bladder is unremarkable. Stomach/Bowel: Extensive diverticulosis of the descending sigmoid colon. No bowel dilatation to indicate ileus or obstruction. Lymphatic: No enlarged abdominal or pelvic lymph nodes. Reproductive: Uterus and bilateral adnexa are unremarkable. Other: No abdominal wall hernia or abnormality. No abdominopelvic ascites. Musculoskeletal: No acute or significant osseous findings. IMPRESSION: VASCULAR 1. No significant lower extremity arterial occlusive disease. 2. 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 NON-VASCULAR Extensive descending and sigmoid colon diverticulosis. Electronically Signed   By: Miachel Roux M.D.   On: 01/21/2021 09:15     Assessment/Plan 1. Pain in both lower extremities Recommend:  I do not find evidence of Vascular pathology that would explain the patient's symptoms  The patient has atypical pain symptoms for vascular disease  I do not find evidence of Vascular pathology that would explain the patient's symptoms and I suspect the patient is c/o pseudoclaudication.  Patient should have an evaluation of his LS spine which I defer to the primary service.  Noninvasive studies including venous ultrasound of the legs do not identify vascular problems  The patient should continue walking and begin a more formal exercise program. The patient should continue his antiplatelet therapy and aggressive treatment of the lipid abnormalities. The patient should begin wearing graduated compression socks 15-20 mmHg strength to control her mild  edema.  Patient will follow-up with me on a PRN basis  Further work-up of her lower extremity pain is deferred to the primary service    - Ambulatory referral to Neurosurgery  2. Gastroesophageal reflux disease without esophagitis Continue PPI as already ordered, this medication has been reviewed and there are no changes at this time.  Avoidence of caffeine and alcohol  Moderate elevation of the head of the bed    3. High cholesterol Continue antihypertensive medications as already ordered, these medications have been reviewed and there are no changes at this time.      Hortencia Pilar, MD  02/09/2021 1:36 PM

## 2021-02-09 NOTE — Telephone Encounter (Signed)
I am sorry for the inconvenience I will have the appointment scheduler call you for a CT follow up appointment.

## 2021-02-10 ENCOUNTER — Encounter (INDEPENDENT_AMBULATORY_CARE_PROVIDER_SITE_OTHER): Payer: Self-pay | Admitting: Vascular Surgery

## 2021-02-10 ENCOUNTER — Ambulatory Visit (INDEPENDENT_AMBULATORY_CARE_PROVIDER_SITE_OTHER): Payer: Commercial Managed Care - PPO | Admitting: Vascular Surgery

## 2021-02-10 ENCOUNTER — Other Ambulatory Visit: Payer: Self-pay

## 2021-02-10 VITALS — BP 140/81 | HR 81 | Resp 15 | Wt 135.2 lb

## 2021-02-10 DIAGNOSIS — M79605 Pain in left leg: Secondary | ICD-10-CM | POA: Diagnosis not present

## 2021-02-10 DIAGNOSIS — E7800 Pure hypercholesterolemia, unspecified: Secondary | ICD-10-CM

## 2021-02-10 DIAGNOSIS — K219 Gastro-esophageal reflux disease without esophagitis: Secondary | ICD-10-CM

## 2021-02-10 DIAGNOSIS — M79604 Pain in right leg: Secondary | ICD-10-CM | POA: Diagnosis not present

## 2021-02-13 ENCOUNTER — Encounter (INDEPENDENT_AMBULATORY_CARE_PROVIDER_SITE_OTHER): Payer: Self-pay | Admitting: Vascular Surgery

## 2021-04-19 ENCOUNTER — Encounter: Payer: Commercial Managed Care - PPO | Admitting: Obstetrics and Gynecology

## 2021-04-21 ENCOUNTER — Ambulatory Visit: Payer: Commercial Managed Care - PPO | Admitting: Vascular Surgery

## 2021-04-21 ENCOUNTER — Encounter: Payer: Self-pay | Admitting: Vascular Surgery

## 2021-04-21 VITALS — BP 144/83 | HR 67 | Temp 97.9°F | Resp 20 | Ht 62.0 in | Wt 138.6 lb

## 2021-04-21 DIAGNOSIS — I70219 Atherosclerosis of native arteries of extremities with intermittent claudication, unspecified extremity: Secondary | ICD-10-CM | POA: Diagnosis not present

## 2021-04-21 NOTE — Progress Notes (Signed)
? ?ASSESSMENT & PLAN  ? ?BILATERAL LOWER EXTREMITY CLAUDICATION: This patient symptoms do appear to be consistent with claudication.  She does have some mild atherosclerotic disease of her infrarenal aorta and appears to have some mild narrowing of her common iliac artery on the left.  She has no significant infrainguinal arterial occlusive disease.  Regardless, I think her symptoms are quite convincing and she does have a drop in her pressures with exercise on her Doppler study.  We had a long discussion about the importance of a structured walking program.  We also discussed the importance of getting off nicotine completely including the nicotine patches as vasospasm can certainly affect collateral flow.  In addition we discussed the importance of nutrition.  I have recommended that she begin taking 81 mg of aspirin daily.  I think she would also benefit from a low-dose statin and she will discuss this with Dr. Tenny Craw.  I would not recommend arteriography and intervention such as iliac stenting if she did have a significant iliac stenosis given that she is quite young and seems very motivated.  I think with continued exercise and nutrition I think her symptoms will gradually improve and we would only consider arteriography if she developed disabling claudication, rest pain, or nonhealing ulcer.  I will be happy to see her back at any time but I think it would be perfectly fine for her to follow-up with Dr. Gilda Crease if her symptoms progress. ? ?REASON FOR CONSULT:   ? ?Second opinion concerning claudication.  The consult is requested by Dr. Gildardo Cranker. ? ?HPI:  ? ?Annette Wallace is a 56 y.o. female who presents for a second opinion concerning her leg pain.  I have reviewed the records from the referring office.  Also reviewed the note from Dr. Gilda Crease on 02/10/2021.  The patient had originally presented with some atypical pain in her lower extremities associated with activity and exercise Doppler study demonstrated  a moderate drop in pressures with ambulation.  Previous arterial Doppler study showed an ABI of 100% on the right and 100% on the left.  There was a moderate decrease with exercise.  CT angiogram was obtained which showed no areas of stenosis in the lower extremities. ? ?My history the patient developed gradual onset of calf claudication 2 years ago that really only occurs when she is walking uphill.  If symptoms are brought on by ambulation and relieved with rest.  She tried different shoes for some time and nothing seemed to help with her symptoms.  She has no rest pain and no history of nonhealing wounds.  She has no significant back pain.  Her symptoms are limited to her calf.  She denies any thigh or hip claudication.  Her symptoms are equal on both sides. ? ?Risk factors for peripheral vascular disease include hypertension, hypercholesterolemia, a family history of premature cardiovascular disease, and history of tobacco use.  She denies any history of diabetes.  She quit smoking a couple years ago.  She had smoked up to a pack per day.  When she is under stress she uses nicotine patches. ? ?Past Medical History:  ?Diagnosis Date  ? Anxiety   ? ASCUS with positive high risk HPV cervical 07/2017  ? Colposcopy negative.  ECC benign  ? Former cigarette smoker QUIT 2011  ? 1.5 PPD SMOKER  ? Goiter O1-25-13  ? 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 Reflux   ? GERD  ? Squamous cell carcinoma 06/2007  ?  OF LEFT EYE  ? ? ?Family History  ?Problem Relation Age of Onset  ? Other Mother   ?     SUICIDE  ? Heart disease Father   ? Diabetes Maternal Grandfather   ? Hypertension Maternal Grandfather   ? Diabetes Paternal Grandfather   ? Hypertension Paternal Grandfather   ? ? ?SOCIAL HISTORY: ?Social History  ? ?Tobacco Use  ? Smoking status: Former  ?  Packs/day: 0.50  ?  Types: Cigarettes  ?  Quit date: 01/16/2021  ?  Years since quitting: 0.2  ? Smokeless tobacco: Never  ?Substance Use Topics  ? Alcohol  use: No  ? ? ?Allergies  ?Allergen Reactions  ? Niaspan [Niacin]   ?  Other reaction(s): skin "burning"  ? Codeine Itching and Rash  ?  "crazy"  ? Penicillins Rash  ? ? ?Current Outpatient Medications  ?Medication Sig Dispense Refill  ? Calcium-Magnesium-Vitamin D (CALCIUM MAGNESIUM PO) Take by mouth daily.    ? cholecalciferol (VITAMIN D3) 25 MCG (1000 UNIT) tablet Take 1,000 Units by mouth daily.    ? nicotine (NICODERM CQ - DOSED IN MG/24 HOURS) 14 mg/24hr patch Place 14 mg onto the skin daily as needed (smoking cessation).    ? Probiotic Product (PROBIOTIC DAILY PO) Take by mouth daily.    ? sodium chloride (OCEAN) 0.65 % SOLN nasal spray Place 1 spray into both nostrils as needed for congestion.    ? metoprolol succinate (TOPROL XL) 50 MG 24 hr tablet Take 1 tablet (50 mg total) by mouth daily. Take with or immediately following a meal. Start with 1/2/ tablet daily for 1 week, then increase to 1 tablet daily. (Patient not taking: Reported on 02/10/2021) 90 tablet 3  ? ?No current facility-administered medications for this visit.  ? ? ?REVIEW OF SYSTEMS:  ?[X]  denotes positive finding, [ ]  denotes negative finding ?Cardiac  Comments:  ?Chest pain or chest pressure:    ?Shortness of breath upon exertion:    ?Short of breath when lying flat:    ?Irregular heart rhythm:    ?    ?Vascular    ?Pain in calf, thigh, or hip brought on by ambulation: x   ?Pain in feet at night that wakes you up from your sleep:     ?Blood clot in your veins:    ?Leg swelling:     ?    ?Pulmonary    ?Oxygen at home:    ?Productive cough:     ?Wheezing:     ?    ?Neurologic    ?Sudden weakness in arms or legs:     ?Sudden numbness in arms or legs:     ?Sudden onset of difficulty speaking or slurred speech:    ?Temporary loss of vision in one eye:     ?Problems with dizziness:     ?    ?Gastrointestinal    ?Blood in stool:     ?Vomited blood:     ?    ?Genitourinary    ?Burning when urinating:     ?Blood in urine:    ?    ?Psychiatric     ?Major depression:     ?    ?Hematologic    ?Bleeding problems:    ?Problems with blood clotting too easily:    ?    ?Skin    ?Rashes or ulcers:    ?    ?Constitutional    ?Fever or chills:    ?- ? ?PHYSICAL EXAM:  ? ?  Vitals:  ? 04/21/21 0907  ?BP: (!) 144/83  ?Pulse: 67  ?Resp: 20  ?Temp: 97.9 ?F (36.6 ?C)  ?SpO2: 97%  ?Weight: 138 lb 9.6 oz (62.9 kg)  ?Height: 5\' 2"  (1.575 m)  ? ?Body mass index is 25.35 kg/m?. ?GENERAL: The patient is a well-nourished female, in no acute distress. The vital signs are documented above. ?CARDIAC: There is a regular rate and rhythm.  ?VASCULAR: I do not detect carotid bruits. ?On the right side she has a palpable femoral, popliteal, and posterior tibial pulse.  She has a slightly diminished dorsalis pedis pulse.  She has a monophasic dorsalis pedis signal on the right with a Doppler and a biphasic posterior tibial signal. ?On the left side she has a palpable femoral, popliteal, dorsalis pedis, and posterior tibial pulse.  She has biphasic Doppler signals in the dorsalis pedis and posterior tibial positions. ?She has no significant lower extremity swelling and no hyperpigmentation. ?PULMONARY: There is good air exchange bilaterally without wheezing or rales. ?ABDOMEN: Soft and non-tender with normal pitched bowel sounds.  I do not palpate an abdominal aortic aneurysm ?MUSCULOSKELETAL: There are no major deformities. ?NEUROLOGIC: No focal weakness or paresthesias are detected. ?SKIN: There are no ulcers or rashes noted. ?PSYCHIATRIC: The patient has a normal affect. ? ?DATA:   ? ?CT ANGIOGRAM: I did review the images of her CT angiogram.  She does have some atherosclerotic plaque and some mild narrowing of the left common iliac artery.  Otherwise study is fairly unremarkable. ? ? ?[facility] ?

## 2021-05-04 NOTE — Progress Notes (Signed)
? ? ?  GYNECOLOGY PROGRESS NOTE ? ?Subjective:  ? ? Patient ID: Annette Wallace, female    DOB: 1965-02-28, 56 y.o.   MRN: [id number] ? ?HPI ? Patient is a 56 y.o. [id number] female who presents for of blood pressure check.  She denies having chest pain, sob, dizziness, palpitations, but has been having headaches. She has stopped taking her Metoprolol 50 mg three days ago.  She said she stopped on her own without being advised.   ? ?Objective:  ? Blood pressure (!) 144/62, pulse 75, resp. rate 16, height 5\' 3"  (1.6 m), weight 131 lb 14.4 oz (59.8 kg), last menstrual period 05/30/2017. Body mass index is 23.37 kg/m?. ?General appearance: alert and no distress ? ? ?Assessment:  ? ?1. Essential hypertension   ?  ? ?Plan:  ? ?Patient to continue to monitor blood pressures. Resume medications if severe range.  Patient had to leave for another appointment prior to completion of visit.  ? ? ?06/01/2017, MD ?[facility]  ?

## 2021-05-06 ENCOUNTER — Ambulatory Visit (INDEPENDENT_AMBULATORY_CARE_PROVIDER_SITE_OTHER): Payer: Commercial Managed Care - PPO | Admitting: Obstetrics and Gynecology

## 2021-05-06 ENCOUNTER — Encounter: Payer: Self-pay | Admitting: Obstetrics and Gynecology

## 2021-05-06 VITALS — BP 144/62 | HR 75 | Resp 16 | Ht 63.0 in | Wt 131.9 lb

## 2021-05-06 DIAGNOSIS — I10 Essential (primary) hypertension: Secondary | ICD-10-CM | POA: Diagnosis not present

## 2021-05-31 NOTE — Progress Notes (Deleted)
    GYNECOLOGY PROGRESS NOTE  Subjective:    Patient ID: Annette Wallace, female    DOB: 09/13/1965, 56 y.o.   MRN: [id number]  HPI  Patient is a 56 y.o. [id number] female who presents for blood pressure check.  {Common ambulatory SmartLinks:19316}  Review of Systems {ros; complete:30496}   Objective:   Last menstrual period 05/30/2017. There is no height or weight on file to calculate BMI. General appearance: {general exam:16600} Abdomen: {abdominal exam:16834} Pelvic: {pelvic exam:16852::"cervix normal in appearance","external genitalia normal","no adnexal masses or tenderness","no cervical motion tenderness","rectovaginal septum normal","uterus normal size, shape, and consistency","vagina normal without discharge"} Extremities: {extremity exam:5109} Neurologic: {neuro exam:17854}   Assessment:   No diagnosis found.   Plan:   There are no diagnoses linked to this encounter.    Hildred Laser, MD [facility]

## 2021-06-03 ENCOUNTER — Encounter: Payer: Commercial Managed Care - PPO | Admitting: Obstetrics and Gynecology

## 2021-06-23 ENCOUNTER — Encounter: Payer: Commercial Managed Care - PPO | Admitting: Obstetrics and Gynecology

## 2021-08-02 ENCOUNTER — Encounter: Payer: Commercial Managed Care - PPO | Admitting: Obstetrics and Gynecology

## 2021-08-17 ENCOUNTER — Encounter: Payer: Commercial Managed Care - PPO | Admitting: Obstetrics and Gynecology

## 2021-09-28 ENCOUNTER — Other Ambulatory Visit: Payer: Self-pay | Admitting: Obstetrics and Gynecology

## 2021-10-10 NOTE — Progress Notes (Deleted)
    GYNECOLOGY PROGRESS NOTE  Subjective:    Patient ID: Annette Wallace, female    DOB: 06-Jul-1965, 56 y.o.   MRN: [id number]  HPI  Patient is a 56 y.o. [id number] female who presents for blood pressure check.  {Common ambulatory SmartLinks:19316}  Review of Systems {ros; complete:30496}   Objective:   Last menstrual period 05/30/2017. There is no height or weight on file to calculate BMI. General appearance: {general exam:16600} Abdomen: {abdominal exam:16834} Pelvic: {pelvic exam:16852::"cervix normal in appearance","external genitalia normal","no adnexal masses or tenderness","no cervical motion tenderness","rectovaginal septum normal","uterus normal size, shape, and consistency","vagina normal without discharge"} Extremities: {extremity exam:5109} Neurologic: {neuro exam:17854}   Assessment:   No diagnosis found.   Plan:   There are no diagnoses linked to this encounter.

## 2021-10-12 ENCOUNTER — Encounter: Payer: Commercial Managed Care - PPO | Admitting: Obstetrics and Gynecology

## 2021-11-14 ENCOUNTER — Encounter (INDEPENDENT_AMBULATORY_CARE_PROVIDER_SITE_OTHER): Payer: Self-pay

## 2022-11-17 ENCOUNTER — Telehealth: Payer: Self-pay

## 2022-11-17 NOTE — Telephone Encounter (Signed)
Chart reviewed. Patient last seen 04/2021 advised to follow up in 3-4 mos for BP Check. Multiple appts cancelled by patient.   Left voicemail to notify patient appointment needed or can request from PCP.

## 2022-11-17 NOTE — Telephone Encounter (Signed)
Pharmacy advised pt needs appointment or can request from PCP.

## 2023-04-26 ENCOUNTER — Emergency Department (HOSPITAL_COMMUNITY)
Admission: EM | Admit: 2023-04-26 | Discharge: 2023-04-26 | Disposition: A | Discharge status: Home / Self Care | Attending: Emergency Medicine | Admitting: Emergency Medicine

## 2023-04-26 ENCOUNTER — Other Ambulatory Visit: Payer: Self-pay

## 2023-04-26 ENCOUNTER — Emergency Department (HOSPITAL_COMMUNITY)

## 2023-04-26 DIAGNOSIS — M25512 Pain in left shoulder: Secondary | ICD-10-CM | POA: Insufficient documentation

## 2023-04-26 DIAGNOSIS — R079 Chest pain, unspecified: Secondary | ICD-10-CM | POA: Diagnosis not present

## 2023-04-26 DIAGNOSIS — R109 Unspecified abdominal pain: Secondary | ICD-10-CM | POA: Insufficient documentation

## 2023-04-26 DIAGNOSIS — R112 Nausea with vomiting, unspecified: Secondary | ICD-10-CM | POA: Diagnosis present

## 2023-04-26 DIAGNOSIS — R197 Diarrhea, unspecified: Secondary | ICD-10-CM | POA: Insufficient documentation

## 2023-04-26 LAB — CBC
HCT: 45.4 % (ref 36.0–46.0)
Hemoglobin: 15.1 g/dL — ABNORMAL HIGH (ref 12.0–15.0)
MCH: 30.3 pg (ref 26.0–34.0)
MCHC: 33.3 g/dL (ref 30.0–36.0)
MCV: 91 fL (ref 80.0–100.0)
Platelets: 290 10*3/uL (ref 150–400)
RBC: 4.99 MIL/uL (ref 3.87–5.11)
RDW: 12.5 % (ref 11.5–15.5)
WBC: 11.6 10*3/uL — ABNORMAL HIGH (ref 4.0–10.5)
nRBC: 0 % (ref 0.0–0.2)

## 2023-04-26 LAB — BASIC METABOLIC PANEL WITH GFR
Anion gap: 10 (ref 5–15)
BUN: 18 mg/dL (ref 6–20)
CO2: 24 mmol/L (ref 22–32)
Calcium: 9.5 mg/dL (ref 8.9–10.3)
Chloride: 105 mmol/L (ref 98–111)
Creatinine, Ser: 0.85 mg/dL (ref 0.44–1.00)
GFR, Estimated: >60 mL/min (ref >60)
Glucose, Bld: 104 mg/dL — ABNORMAL HIGH (ref 70–99)
Potassium: 4.3 mmol/L (ref 3.5–5.1)
Sodium: 139 mmol/L (ref 135–145)

## 2023-04-26 LAB — HEPATIC FUNCTION PANEL
ALT: 20 U/L (ref 0–44)
AST: 19 U/L (ref 15–41)
Albumin: 4.3 g/dL (ref 3.5–5.0)
Alkaline Phosphatase: 83 U/L (ref 38–126)
Bilirubin, Direct: <0.1 mg/dL (ref 0.0–0.2)
Total Bilirubin: 0.8 mg/dL (ref 0.0–1.2)
Total Protein: 7.6 g/dL (ref 6.5–8.1)

## 2023-04-26 LAB — LIPASE, BLOOD: Lipase: 25 U/L (ref 11–51)

## 2023-04-26 LAB — TROPONIN I (HIGH SENSITIVITY)
Troponin I (High Sensitivity): 4 ng/L (ref <18)
Troponin I (High Sensitivity): 4 ng/L (ref <18)

## 2023-04-26 MED ORDER — ONDANSETRON HCL 4 MG/2ML IJ SOLN
4.0000 mg | Freq: Once | INTRAMUSCULAR | Status: AC
  Administered 2023-04-26: 4 mg via INTRAVENOUS
  Filled 2023-04-26: qty 2

## 2023-04-26 MED ORDER — ONDANSETRON 4 MG PO TBDP: 4.0000 mg | ORAL_TABLET | Freq: Three times a day (TID) | ORAL | 0 refills | Status: AC | PRN

## 2023-04-26 MED ORDER — LACTATED RINGERS IV BOLUS
1000.0000 mL | Freq: Once | INTRAVENOUS | Status: AC
  Administered 2023-04-26: 1000 mL via INTRAVENOUS

## 2023-04-26 NOTE — Discharge Instructions (Addendum)
 Today you are seen for chest pain, nausea, vomiting, and diarrhea.  I suspect you likely have a viral GI illness.  Please pick up your Zofran and take as prescribed.  Please take over-the-counter Imodium sparingly as needed for diarrhea.  You may take Tylenol as needed for your shoulder pain, you may also ice or place heat on the area.  Please follow-up with your primary care if your symptoms persist for further evaluation and treatment.  Thank you for letting us treat you today. After reviewing your labs and imaging, I feel you are safe to go home. Please follow up with your PCP in the next several days and provide them with your records from this visit. Return to the Emergency Room if pain becomes severe or symptoms worsen.

## 2023-04-26 NOTE — ED Notes (Signed)
 Patient discharged by RN. Patient ambulatory to lobby.

## 2023-04-26 NOTE — ED Triage Notes (Signed)
 Pt. Stated, I started Monday having stomach pain, N/V, left arm pain and chest pain , all the symptoms were intermittent.

## 2023-04-26 NOTE — ED Provider Notes (Signed)
 [place] EMERGENCY DEPARTMENT AT [facility] Provider Note   CSN: [id number] Arrival date & time: 04/26/23  3875     History  Chief Complaint  Patient presents with   Abdominal Pain   Emesis   Nausea   Chest Pain   Arm Pain    Annette Wallace is a 58 y.o. female past medical history significant for right upper quadrant pain, diverticular disease, and GERD presents today for intermittent abdominal pain, nausea, vomiting, and diarrhea.  The symptoms began on Monday.  Patient also noted that approximately 2 days ago she began having intermittent chest pain and left shoulder pain.  Patient denies fever, chills, shortness of breath, blood in stool, headache, cough, or any other complaints at this time.   Abdominal Pain Associated symptoms: chest pain, diarrhea, nausea and vomiting   Emesis Associated symptoms: abdominal pain, arthralgias and diarrhea   Chest Pain Associated symptoms: abdominal pain, nausea and vomiting   Arm Pain Associated symptoms include chest pain and abdominal pain.       Home Medications Prior to Admission medications   Medication Sig Start Date End Date Taking? Authorizing Provider  cholecalciferol (VITAMIN D3) 25 MCG (1000 UNIT) tablet Take 1,000 Units by mouth daily.   Yes [provider]  Cyanocobalamin (VITAMIN B-12 PO) Take 1 capsule by mouth daily.   Yes [provider]  metoprolol succinate (TOPROL-XL) 50 MG 24 hr tablet TAKE 1 TABLET (50 MG TOTAL) BY MOUTH DAILY. TAKE WITH OR IMMEDIATELY FOLLOWING A MEAL. START WITH 1/2/ TABLET DAILY FOR 1 WEEK, THEN INCREASE TO 1 TABLET DAILY. 09/28/21  Yes Hildred Laser, MD  nicotine (NICODERM CQ - DOSED IN MG/24 HOURS) 14 mg/24hr patch Place 14 mg onto the skin daily.   Yes [provider]  Probiotic Product (PROBIOTIC DAILY PO) Take 1 capsule by mouth daily.   Yes [provider]  VITAMIN E PO Take 1 capsule by mouth daily.   Yes [provider]   ondansetron (ZOFRAN-ODT) 4 MG disintegrating tablet Take 1 tablet (4 mg total) by mouth every 8 (eight) hours as needed for nausea or vomiting. 04/26/23  Yes Dolphus Jenny, PA-C  PLENVU 140 g SOLR Take by mouth as directed. Patient not taking: Reported on 04/26/2023 04/03/23   [provider]      Allergies    Niaspan [niacin], Codeine, and Penicillins    Review of Systems   Review of Systems  Cardiovascular:  Positive for chest pain.  Gastrointestinal:  Positive for abdominal pain, diarrhea, nausea and vomiting.  Musculoskeletal:  Positive for arthralgias.    Physical Exam Updated Vital Signs BP (!) 140/76 (BP Location: Right Arm)   Pulse 79   Temp 98.7 F (37.1 C)   Resp 16   Ht 5\' 2"  (1.575 m)   Wt 66.2 kg   LMP 05/30/2017   SpO2 100%   BMI 26.70 kg/m  Physical Exam Vitals and nursing note reviewed.  Constitutional:      General: She is not in acute distress.    Appearance: She is well-developed and normal weight. She is not ill-appearing, toxic-appearing or diaphoretic.  HENT:     Head: Normocephalic and atraumatic.     Mouth/Throat:     Mouth: Mucous membranes are moist.  Eyes:     Extraocular Movements: Extraocular movements intact.     Conjunctiva/sclera: Conjunctivae normal.  Cardiovascular:     Rate and Rhythm: Normal rate and regular rhythm.     Heart sounds:  Normal heart sounds. No murmur heard. Pulmonary:     Effort: Pulmonary effort is normal. No respiratory distress.     Breath sounds: Normal breath sounds.  Abdominal:     General: Abdomen is flat. Bowel sounds are normal. There is no distension.     Palpations: Abdomen is soft.     Tenderness: There is no abdominal tenderness. Negative signs include Murphy's sign, Rovsing's sign, McBurney's sign and psoas sign.  Musculoskeletal:        General: No swelling.     Left shoulder: Normal. Normal pulse.     Left upper arm: Normal. No swelling, tenderness or bony tenderness.     Cervical back:  Neck supple.  Skin:    General: Skin is warm and dry.     Capillary Refill: Capillary refill takes less than 2 seconds.  Neurological:     General: No focal deficit present.     Mental Status: She is alert.     Motor: No weakness.  Psychiatric:        Mood and Affect: Mood normal.     ED Results / Procedures / Treatments   Labs (all labs ordered are listed, but only abnormal results are displayed) Labs Reviewed  BASIC METABOLIC PANEL WITH GFR - Abnormal; Notable for the following components:      Result Value   Glucose, Bld 104 (*)    All other components within normal limits  CBC - Abnormal; Notable for the following components:   WBC 11.6 (*)    Hemoglobin 15.1 (*)    All other components within normal limits  LIPASE, BLOOD  HEPATIC FUNCTION PANEL  TROPONIN I (HIGH SENSITIVITY)  TROPONIN I (HIGH SENSITIVITY)    EKG EKG Interpretation Date/Time:  Thursday April 26 2023 09:31:33 EDT Ventricular Rate:  79 PR Interval:  168 QRS Duration:  74 QT Interval:  348 QTC Calculation: 399 R Axis:   71  Text Interpretation: Normal sinus rhythm Right atrial enlargement Anteroseptal infarct , age undetermined No previous ECGs available  Confirmed by Gwyneth Sprout ([id number]) on 04/26/2023 10:04:47 AM  Radiology DG Chest 2 View Result Date: 04/26/2023 CLINICAL DATA:  Chest pain for 4 days. EXAM: CHEST - 2 VIEW COMPARISON:  04/06/2020. FINDINGS: Bilateral lung fields are clear. Bilateral costophrenic angles are clear. Normal cardio-mediastinal silhouette. No acute osseous abnormalities. Lower cervical spinal fixation hardware noted. The soft tissues are within normal limits. IMPRESSION: *No active cardiopulmonary disease. Electronically Signed   By: Jules Schick M.D.   On: 04/26/2023 11:16    Procedures Procedures    Medications Ordered in ED Medications  ondansetron (ZOFRAN) injection 4 mg (4 mg Intravenous Given 04/26/23 1201)  lactated ringers bolus 1,000 mL (1,000 mLs  Intravenous New Bag/Given 04/26/23 1202)    ED Course/ Medical Decision Making/ A&P                                 Medical Decision Making Amount and/or Complexity of Data Reviewed Labs: ordered. Radiology: ordered.   This patient presents to the ED for concern of abdominal and chest pain, this involves an extensive number of treatment options, and is a complaint that carries with it a high risk of complications and morbidity.  The differential diagnosis includes STEMI, NSTEMI, GERD, diverticulitis, viral GI illness, appendicitis, pancreatitis, choledocholithiasis   Co morbidities that complicate the patient evaluation  GERD, diverticular disease, right upper quadrant pain   Lab Tests:  I Ordered, and personally interpreted labs.  The pertinent results include: Mild leukocytosis at 11.6 likely reactive, CMP, lipase, delta troponin WNL.   Imaging Studies ordered:  I ordered imaging studies including chest x-ray I independently visualized and interpreted imaging which showed no active cardiopulmonary disease I agree with the radiologist interpretation   Cardiac Monitoring: / EKG:  The patient was maintained on a cardiac monitor.  I personally viewed and interpreted the cardiac monitored which showed an underlying rhythm of: NSR, RAE   Problem List / ED Course / Critical interventions / Medication management  I ordered medication including Zofran and lactated Ringer's for nausea and dehydration Reevaluation of the patient after these medicines showed that the patient improved I have reviewed the patients home medicines and have made adjustments as needed    Test / Admission - Considered:  Considered CT abdomen pelvis with contrast, however using shared decision making and discussing that the patient does not have any abdominal tenderness at this time and only has pain when having episodes of emesis or diarrhea, that imaging would likely not change treatment plan.  Patient  agreeable to defer CT at this time. Considered for admission or further workup however patient's vital signs, physical exam, labs, and imagings were reassuring.  Patient prescribed short course of Zofran outpatient for nausea.  Patient also advised to take Tylenol as needed for pain and Imodium sparingly for diarrhea.        Final Clinical Impression(s) / ED Diagnoses Final diagnoses:  Nausea vomiting and diarrhea  Chest pain, unspecified type    Rx / DC Orders ED Discharge Orders          Ordered    ondansetron (ZOFRAN-ODT) 4 MG disintegrating tablet  Every 8 hours PRN        04/26/23 1348              Dolphus Jenny, PA-C 04/26/23 1351    Pricilla Loveless, MD 04/26/23 [phone]

## 2023-04-26 NOTE — ED Provider Triage Note (Signed)
 Emergency Medicine Provider Triage Evaluation Note  Annette Wallace , a 58 y.o. female  was evaluated in triage.  Pt complains of persistent nausea vomiting diarrhea and left arm pain that has been going on since Monday.  Also generalized abdominal bloating  Review of Systems  Positive: Nausea, vomiting, diarrhea, left arm and some mild chest pain Negative: Fever, urinary symptoms, shortness of breath  Physical Exam  BP (!) 140/76 (BP Location: Right Arm)   Pulse 79   Temp 98.7 F (37.1 C)   Resp 16   Ht 5\' 2"  (1.575 m)   Wt 66.2 kg   LMP 05/30/2017   SpO2 100%   BMI 26.70 kg/m  Gen:   Awake, no distress   Resp:  Normal effort  MSK:   Moves extremities without difficulty  Other:  Soft with positive bowel sounds.  Mild diffuse discomfort  Medical Decision Making  Medically screening exam initiated at 10:41 AM.  Appropriate orders placed.  Cynthie L Maish was informed that the remainder of the evaluation will be completed by another provider, this initial triage assessment does not replace that evaluation, and the importance of remaining in the ED until their evaluation is complete.     Gwyneth Sprout, MD 04/26/23 [phone]

## 2023-05-03 IMAGING — CT CT ANGIO AOBIFEM WO/W CM
1 of 15 series · 13 of 48 positions shown, 18 images · IV contrast (APPLIED)
Comparison: None.

CLINICAL DATA: Claudication

Bilateral lower extremity pain
Atherosclerosis of native arteries of lower extremities
Bilateral calf pa[REDACTED]reased exercise tolerance
EXAM:
CT ANGIOGRAPHY OF ABDOMINAL AORTA WITH ILIOFEMORAL RUNOFF
TECHNIQUE: Multidetector CT imaging of the abdomen, pelvis and lower
extremities was performed using the standard protocol during bolus
administration of intravenous contrast. Multiplanar CT image
reconstructions and MIPs were obtained to evaluate the vascular
anatomy.
CONTRAST:  100mL OMNIPAQUE IOHEXOL 350 MG/ML SOLN

[Series 4: axial arterial upper · axial · arterial · 0.95mm/px · z∈[+106,+1279]mm · 13 of 438 slices shown, 18 images]
[im 24/438  soft-tissue]
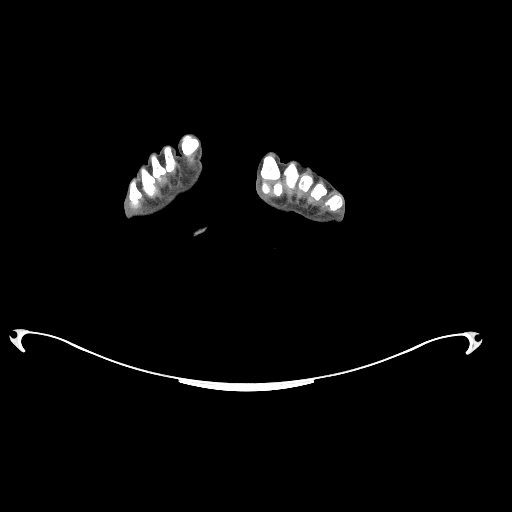
[im 24/438  bone]
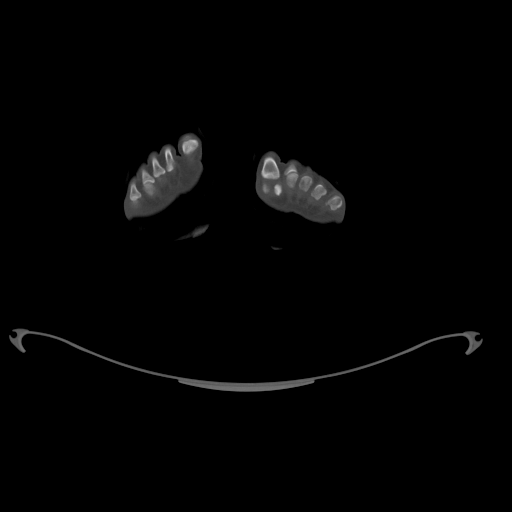
[im 70/438  soft-tissue]
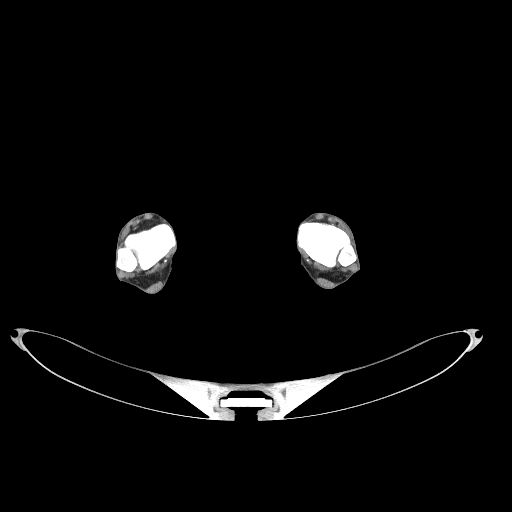
[im 93/438  soft-tissue]
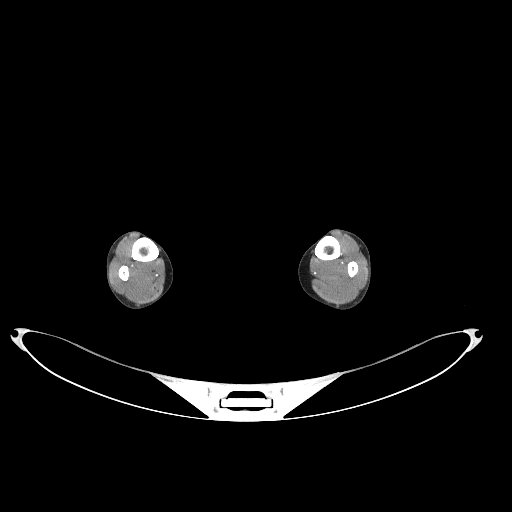
[im 139/438  soft-tissue]
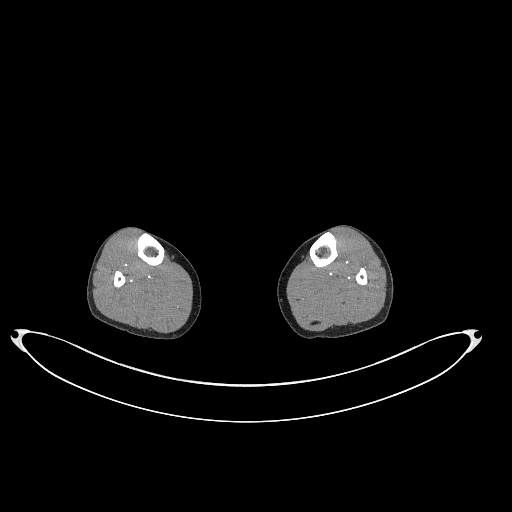
[im 162/438  soft-tissue]
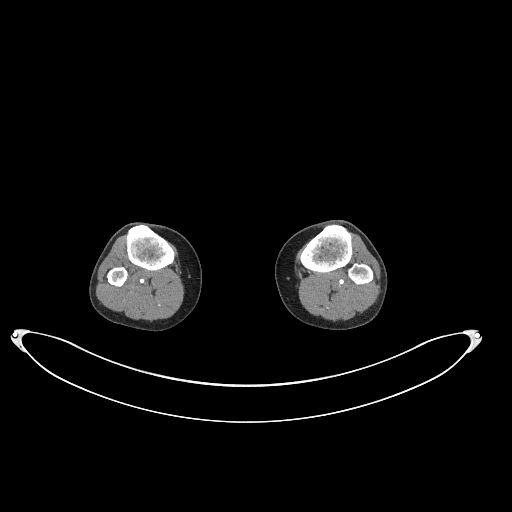
[im 208/438  soft-tissue]
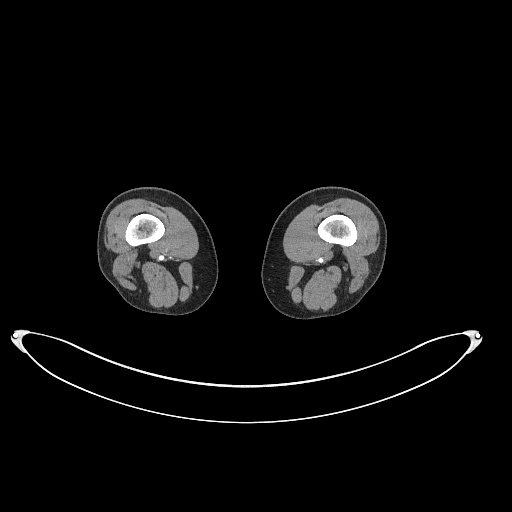
[im 231/438  soft-tissue]
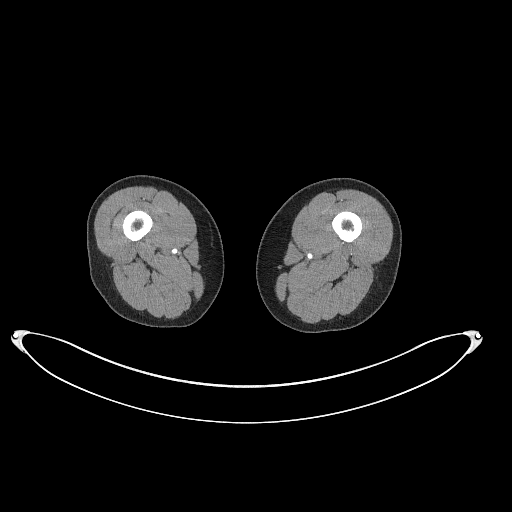
[im 277/438  soft-tissue]
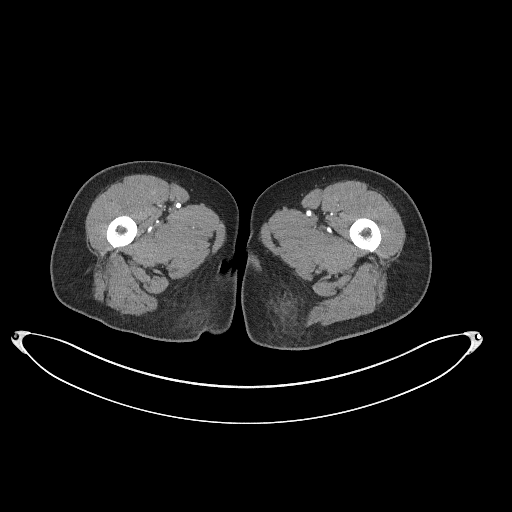
[im 300/438  soft-tissue]
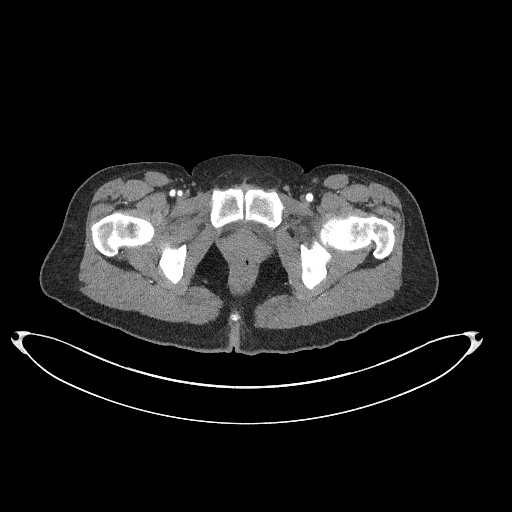
[im 300/438  bone]
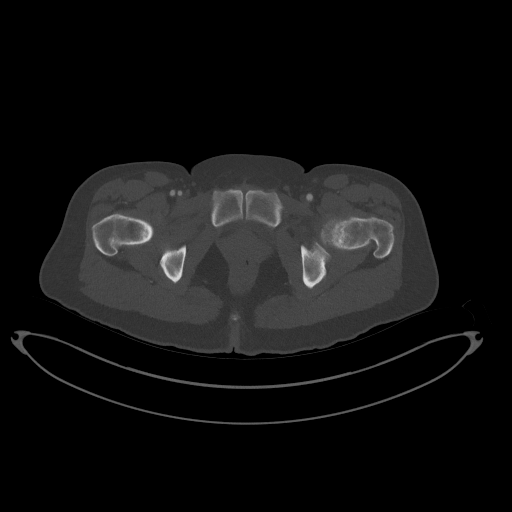
[im 346/438  soft-tissue]
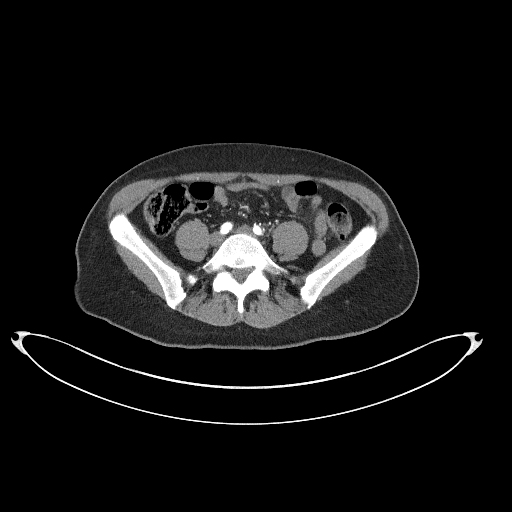
[im 346/438  lung]
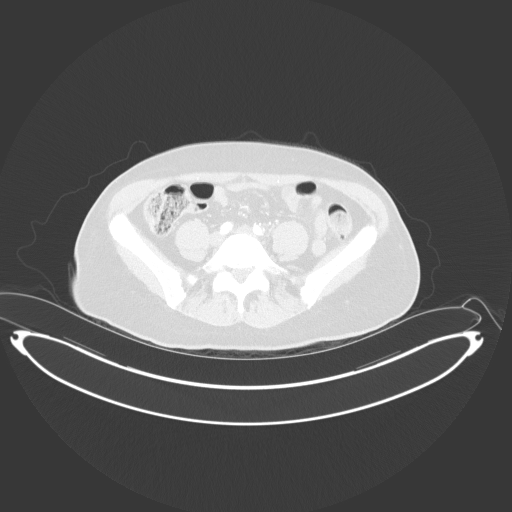
[im 369/438  soft-tissue]
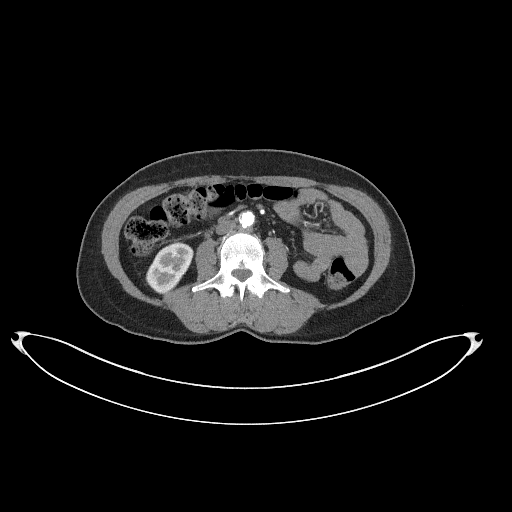
[im 369/438  lung]
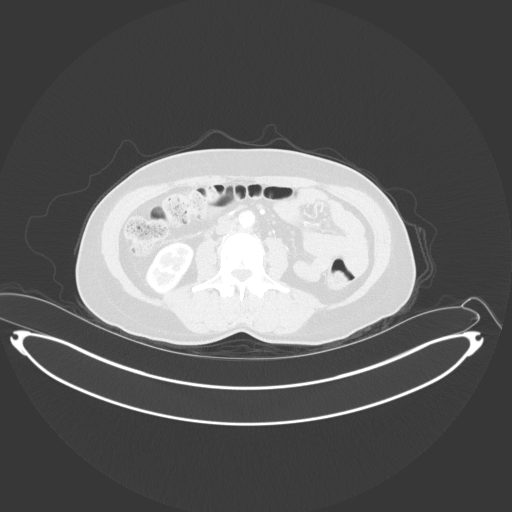
[im 392/438  lung]
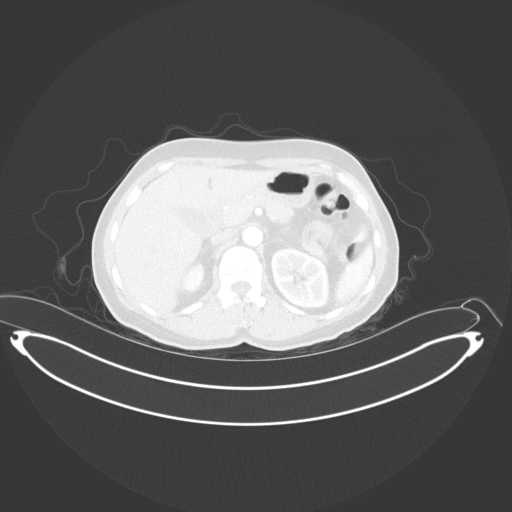
[im 415/438  soft-tissue]
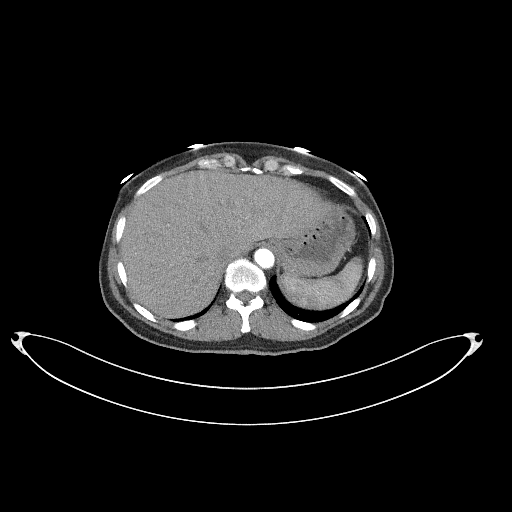
[im 415/438  lung]
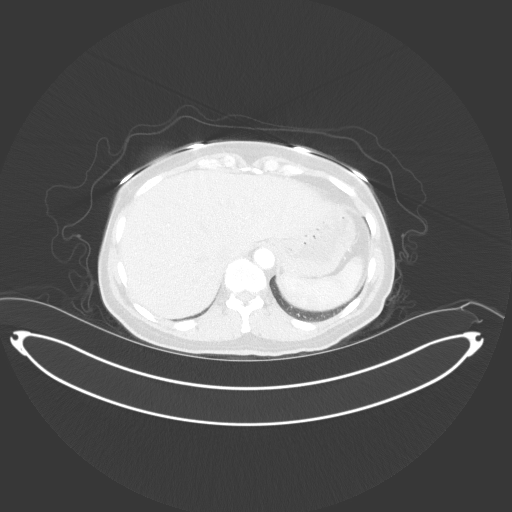

[13 of 48 positions shown; findings below may reference images not displayed]

FINDINGS: VASCULAR

Aorta: Mild atheromatous calcified and noncalcified plaque of the
infrarenal abdominal aorta without flow-limiting stenosis or
aneurysm.

Celiac: Moderate stenosis of the origin.  Otherwise patent.

SMA: No significant stenosis

Renals: No significant stenosis.

IMA: No significant stenosis.

RIGHT Lower Extremity

Inflow: Common, internal and external iliac arteries are patent
without evidence of aneurysm, dissection, vasculitis or significant
stenosis.

Outflow: Common, superficial and profunda femoral arteries and the
popliteal artery are patent without evidence of aneurysm,
dissection, vasculitis or significant stenosis.

Runoff: Patent three vessel runoff to the ankle.

LEFT Lower Extremity

Inflow: Common, internal and external iliac arteries are patent
without evidence of aneurysm, dissection, vasculitis or significant
stenosis.

Outflow: Common, superficial and profunda femoral arteries and the
popliteal artery are patent without evidence of aneurysm,
dissection, vasculitis or significant stenosis.

Runoff: Patent three vessel runoff to the ankle.

Veins: No obvious venous abnormality within the limitations of this
arterial phase study.

Review of the MIP images confirms the above findings.

NON-VASCULAR

Lower chest: No acute abnormality.

Hepatobiliary: No focal liver abnormality is seen. No gallstones,
gallbladder wall thickening, or biliary dilatation.

Pancreas: Unremarkable. No pancreatic ductal dilatation or
surrounding inflammatory changes.

Spleen: Normal in size without focal abnormality.

Adrenals/Urinary Tract: Adrenal glands are unremarkable. Kidneys are
normal, without renal calculi, focal lesion, or hydronephrosis.
Bladder is unremarkable.

Stomach/Bowel: Extensive diverticulosis of the descending sigmoid
colon. No bowel dilatation to indicate ileus or obstruction.

Lymphatic: No enlarged abdominal or pelvic lymph nodes.

Reproductive: Uterus and bilateral adnexa are unremarkable.

Other: No abdominal wall hernia or abnormality. No abdominopelvic
ascites.

Musculoskeletal: No acute or significant osseous findings.
IMPRESSION: VASCULAR

1. No significant lower extremity arterial occlusive disease.
2. Moderate stenosis at the origin of the celiac artery.

NON-VASCULAR

Extensive descending and sigmoid colon diverticulosis.

## 2023-07-03 ENCOUNTER — Encounter: Payer: Self-pay | Admitting: Internal Medicine

## 2023-08-06 ENCOUNTER — Encounter: Payer: Self-pay | Admitting: Internal Medicine

## 2023-08-06 ENCOUNTER — Ambulatory Visit (AMBULATORY_SURGERY_CENTER)

## 2023-08-06 VITALS — Ht 63.0 in | Wt 154.0 lb

## 2023-08-06 DIAGNOSIS — R195 Other fecal abnormalities: Secondary | ICD-10-CM

## 2023-08-06 NOTE — Progress Notes (Signed)

## 2023-08-23 ENCOUNTER — Encounter: Payer: Self-pay | Admitting: Internal Medicine

## 2023-08-23 ENCOUNTER — Ambulatory Visit (AMBULATORY_SURGERY_CENTER): Admitting: Internal Medicine

## 2023-08-23 VITALS — BP 123/98 | HR 73 | Temp 98.1°F | Resp 15 | Ht 63.0 in | Wt 154.0 lb

## 2023-08-23 DIAGNOSIS — R195 Other fecal abnormalities: Secondary | ICD-10-CM | POA: Diagnosis not present

## 2023-08-23 DIAGNOSIS — K5730 Diverticulosis of large intestine without perforation or abscess without bleeding: Secondary | ICD-10-CM

## 2023-08-23 DIAGNOSIS — K648 Other hemorrhoids: Secondary | ICD-10-CM | POA: Diagnosis not present

## 2023-08-23 DIAGNOSIS — K6389 Other specified diseases of intestine: Secondary | ICD-10-CM | POA: Diagnosis not present

## 2023-08-23 DIAGNOSIS — Z1211 Encounter for screening for malignant neoplasm of colon: Secondary | ICD-10-CM | POA: Diagnosis present

## 2023-08-23 MED ORDER — SODIUM CHLORIDE 0.9 % IV SOLN: 500.0000 mL | INTRAVENOUS | Status: DC

## 2023-08-23 NOTE — Progress Notes (Signed)
 Pt's states no medical or surgical changes since previsit or office visit.

## 2023-08-23 NOTE — Op Note (Signed)
 [facility] Patient Name: Annette Wallace Procedure Date: 08/23/2023 7:22 AM MRN: [id number] Endoscopist: Norleen SAILOR. Abran , MD, [id number] Age: 58 Referring MD:  Date of Birth: [birth year] Gender: Female Account #: [id number] Procedure:                Colonoscopy Indications:              Positive Cologuard test. Screening colonoscopy.                            Previous examination 2008 was negative for neoplasia Medicines:                Monitored Anesthesia Care Procedure:                Pre-Anesthesia Assessment:                           - Prior to the procedure, a History and Physical                            was performed, and patient medications and                            allergies were reviewed. The patient's tolerance of                            previous anesthesia was also reviewed. The risks                            and benefits of the procedure and the sedation                            options and risks were discussed with the patient.                            All questions were answered, and informed consent                            was obtained. Prior Anticoagulants: The patient has                            taken no anticoagulant or antiplatelet agents. ASA                            Grade Assessment: II - A patient with mild systemic                            disease. After reviewing the risks and benefits,                            the patient was deemed in satisfactory condition to                            undergo the procedure.  After obtaining informed consent, the colonoscope                            was passed under direct vision. Throughout the                            procedure, the patient's blood pressure, pulse, and                            oxygen saturations were monitored continuously. The                            CF [id number] #[id number] was introduced through the anus                            and  advanced to the the cecum, identified by                            appendiceal orifice and ileocecal valve. The                            ileocecal valve, appendiceal orifice, and rectum                            were photographed. The quality of the bowel                            preparation was excellent. The colonoscopy was                            performed without difficulty. The patient tolerated                            the procedure well. The bowel preparation used was                            SUPREP via split dose instruction. Scope In: 8:30:07 AM Scope Out: 8:47:51 AM Scope Withdrawal Time: 0 hours 12 minutes 31 seconds  Total Procedure Duration: 0 hours 17 minutes 44 seconds  Findings:                 Many diverticula were found in the distal                            transverse colon and left colon.                           A diffuse area of severe melanosis was found in the                            entire colon. Small internal hemorrhoids.                           The exam was otherwise without abnormality on  direct and retroflexion views. Complications:            No immediate complications. Estimated blood loss:                            None. Estimated Blood Loss:     Estimated blood loss: none. Impression:               - Diverticulosis in the distal transverse colon and                            in the left colon.                           - Melanosis in the colon.                           - The examination was otherwise normal on direct                            and retroflexion views.                           - No specimens collected. Recommendation:           - Repeat colonoscopy in 10 years for screening                            purposes.                           - Patient has a contact number available for                            emergencies. The signs and symptoms of potential                             delayed complications were discussed with the                            patient. Return to normal activities tomorrow.                            Written discharge instructions were provided to the                            patient.                           - Resume previous diet.                           - Continue present medications. Norleen SAILOR. Abran, MD 08/23/2023 9:02:47 AM This report has been signed electronically.

## 2023-08-23 NOTE — Patient Instructions (Addendum)
 Thank you for letting us  take care of your healthcare needs today. Please see handouts given to you on Diverticulosis, Hemorrhoids and High Fiber Diet.    YOU HAD AN ENDOSCOPIC PROCEDURE TODAY AT THE  ENDOSCOPY CENTER:   Refer to the procedure report that was given to you for any specific questions about what was found during the examination.  If the procedure report does not answer your questions, please call your gastroenterologist to clarify.  If you requested that your care partner not be given the details of your procedure findings, then the procedure report has been included in a sealed envelope for you to review at your convenience later.  YOU SHOULD EXPECT: Some feelings of bloating in the abdomen. Passage of more gas than usual.  Walking can help get rid of the air that was put into your GI tract during the procedure and reduce the bloating. If you had a lower endoscopy (such as a colonoscopy or flexible sigmoidoscopy) you may notice spotting of blood in your stool or on the toilet paper. If you underwent a bowel prep for your procedure, you may not have a normal bowel movement for a few days.  Please Note:  You might notice some irritation and congestion in your nose or some drainage.  This is from the oxygen used during your procedure.  There is no need for concern and it should clear up in a day or so.  SYMPTOMS TO REPORT IMMEDIATELY:  Following lower endoscopy (colonoscopy or flexible sigmoidoscopy):  Excessive amounts of blood in the stool  Significant tenderness or worsening of abdominal pains  Swelling of the abdomen that is new, acute  Fever of 100F or higher   For urgent or emergent issues, a gastroenterologist can be reached at any hour by calling [phone]. Do not use MyChart messaging for urgent concerns.    DIET:  We do recommend a small meal at first, but then you may proceed to your regular diet.  Drink plenty of fluids but you should avoid alcoholic  beverages for 24 hours.  ACTIVITY:  You should plan to take it easy for the rest of today and you should NOT DRIVE or use heavy machinery until tomorrow (because of the sedation medicines used during the test).    FOLLOW UP: Our staff will call the number listed on your records the next business day following your procedure.  We will call around 7:15- 8:00 am to check on you and address any questions or concerns that you may have regarding the information given to you following your procedure. If we do not reach you, we will leave a message.     If any biopsies were taken you will be contacted by phone or by letter within the next 1-3 weeks.  Please call us  at [phone] if you have not heard about the biopsies in 3 weeks.    SIGNATURES/CONFIDENTIALITY: You and/or your care partner have signed paperwork which will be entered into your electronic medical record.  These signatures attest to the fact that that the information above on your After Visit Summary has been reviewed and is understood.  Full responsibility of the confidentiality of this discharge information lies with you and/or your care-partner.

## 2023-08-23 NOTE — Progress Notes (Signed)
 HISTORY OF PRESENT ILLNESS:  Annette Wallace is a 58 y.o. female presents today for screening colonoscopy after positive Cologuard testing.  No complaints.  Previous examination 2008 was negative for neoplasia  REVIEW OF SYSTEMS:  All non-GI ROS negative except for  Past Medical History:  Diagnosis Date   Anxiety    ASCUS with positive high risk HPV cervical 07/2017   Colposcopy negative.  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 Her smoking use included cigarettes. She has never used smokeless tobacco. She reports that she does not drink alcohol and does not use drugs.  family history includes Diabetes in her maternal grandfather and paternal grandfather; Heart disease in her father; Hypertension in her maternal grandfather and paternal grandfather; Other in her mother.  Allergies  Allergen Reactions   Codeine Itching and Rash    crazy   Niaspan [Niacin] Other (See Comments)    Skin burning   Penicillins Rash       PHYSICAL EXAMINATION: Vital signs: BP (!) 173/83   Pulse 64   Temp 98.1 F (36.7 C)   Resp 15   Ht 5' 3 (1.6 m)   Wt 154 lb (69.9 kg)   LMP 05/30/2017   SpO2 100%   BMI 27.28 kg/m  General: Well-developed, well-nourished, no acute distress HEENT: Sclerae are anicteric, conjunctiva  pink. Oral mucosa intact Lungs: Clear Heart: Regular Abdomen: soft, nontender, nondistended, no obvious ascites, no peritoneal signs, normal bowel sounds. No organomegaly. Extremities: No edema Psychiatric: alert and oriented x3. Cooperative     ASSESSMENT:  Colon cancer screening Positive Cologuard testing  PLAN:  Screening colonoscopy

## 2023-08-23 NOTE — Progress Notes (Signed)
 Report to PACU, RN, vss, BBS= Clear.

## 2023-08-24 ENCOUNTER — Telehealth: Payer: Self-pay | Admitting: *Deleted

## 2023-08-24 NOTE — Telephone Encounter (Signed)
  Follow up Call-     08/23/2023    7:37 AM  Call back number  Post procedure Call Back phone  # [phone]  Permission to leave phone message Yes     Patient questions:  Do you have a fever, pain , or abdominal swelling? No. Pain Score  0 *  Have you tolerated food without any problems? Yes.    Have you been able to return to your normal activities? Yes.    Do you have any questions about your discharge instructions: Diet   No. Medications  No. Follow up visit  No.  Do you have questions or concerns about your Care? No.  Actions: * If pain score is 4 or above: No action needed, pain <4.
